# Patient Record
Sex: Female | Born: 1960
Health system: Southern US, Community
[De-identification: ages and names within clinical notes are randomized; demographics above are authoritative.]

## PROBLEM LIST (undated history)

## (undated) DIAGNOSIS — K589 Irritable bowel syndrome without diarrhea: Secondary | ICD-10-CM

## (undated) DIAGNOSIS — B259 Cytomegaloviral disease, unspecified: Secondary | ICD-10-CM

## (undated) DIAGNOSIS — K449 Diaphragmatic hernia without obstruction or gangrene: Secondary | ICD-10-CM

## (undated) HISTORY — DX: Diaphragmatic hernia without obstruction or gangrene: K44.9

## (undated) HISTORY — PX: OTHER SURGICAL HISTORY: SHX169

## (undated) HISTORY — DX: Irritable bowel syndrome, unspecified: K58.9

## (undated) HISTORY — DX: Cytomegaloviral disease, unspecified: B25.9

---

## 1998-08-13 ENCOUNTER — Ambulatory Visit (HOSPITAL_COMMUNITY): Admission: RE | Admit: 1998-08-13 | Discharge: 1998-08-13 | Payer: Self-pay | Admitting: Obstetrics and Gynecology

## 1998-08-13 ENCOUNTER — Encounter: Payer: Self-pay | Admitting: Obstetrics and Gynecology

## 1999-04-01 ENCOUNTER — Other Ambulatory Visit: Admission: RE | Admit: 1999-04-01 | Discharge: 1999-04-01 | Payer: Self-pay | Admitting: Obstetrics and Gynecology

## 2000-02-03 ENCOUNTER — Encounter: Payer: Self-pay | Admitting: Specialist

## 2000-02-03 ENCOUNTER — Ambulatory Visit (HOSPITAL_COMMUNITY): Admission: RE | Admit: 2000-02-03 | Discharge: 2000-02-03 | Payer: Self-pay | Admitting: Specialist

## 2000-03-07 ENCOUNTER — Encounter
Admission: RE | Admit: 2000-03-07 | Discharge: 2000-03-24 | Payer: Self-pay | Admitting: Physical Medicine & Rehabilitation

## 2000-08-17 ENCOUNTER — Encounter: Admission: RE | Admit: 2000-08-17 | Discharge: 2000-08-17 | Payer: Self-pay | Admitting: Family Medicine

## 2000-08-17 ENCOUNTER — Encounter: Payer: Self-pay | Admitting: Family Medicine

## 2000-09-29 ENCOUNTER — Encounter: Payer: Self-pay | Admitting: Gastroenterology

## 2000-09-29 ENCOUNTER — Ambulatory Visit (HOSPITAL_COMMUNITY): Admission: RE | Admit: 2000-09-29 | Discharge: 2000-09-29 | Payer: Self-pay | Admitting: Gastroenterology

## 2000-10-06 ENCOUNTER — Encounter (INDEPENDENT_AMBULATORY_CARE_PROVIDER_SITE_OTHER): Payer: Self-pay

## 2000-10-06 ENCOUNTER — Other Ambulatory Visit: Admission: RE | Admit: 2000-10-06 | Discharge: 2000-10-06 | Payer: Self-pay | Admitting: Gastroenterology

## 2000-10-10 ENCOUNTER — Encounter: Payer: Self-pay | Admitting: Gastroenterology

## 2000-10-10 ENCOUNTER — Ambulatory Visit (HOSPITAL_COMMUNITY): Admission: RE | Admit: 2000-10-10 | Discharge: 2000-10-10 | Payer: Self-pay | Admitting: Gastroenterology

## 2000-12-11 ENCOUNTER — Ambulatory Visit (HOSPITAL_COMMUNITY): Admission: RE | Admit: 2000-12-11 | Discharge: 2000-12-11 | Payer: Self-pay | Admitting: General Surgery

## 2000-12-11 ENCOUNTER — Encounter: Payer: Self-pay | Admitting: General Surgery

## 2000-12-13 ENCOUNTER — Ambulatory Visit (HOSPITAL_COMMUNITY): Admission: RE | Admit: 2000-12-13 | Discharge: 2000-12-13 | Payer: Self-pay | Admitting: General Surgery

## 2000-12-13 ENCOUNTER — Encounter: Payer: Self-pay | Admitting: General Surgery

## 2001-03-15 ENCOUNTER — Other Ambulatory Visit: Admission: RE | Admit: 2001-03-15 | Discharge: 2001-03-15 | Payer: Self-pay | Admitting: Internal Medicine

## 2001-05-15 ENCOUNTER — Observation Stay (HOSPITAL_COMMUNITY): Admission: RE | Admit: 2001-05-15 | Discharge: 2001-05-16 | Payer: Self-pay | Admitting: General Surgery

## 2001-05-15 ENCOUNTER — Encounter: Payer: Self-pay | Admitting: General Surgery

## 2001-05-15 ENCOUNTER — Encounter (INDEPENDENT_AMBULATORY_CARE_PROVIDER_SITE_OTHER): Payer: Self-pay

## 2002-01-14 ENCOUNTER — Encounter: Admission: RE | Admit: 2002-01-14 | Discharge: 2002-01-14 | Payer: Self-pay | Admitting: Obstetrics and Gynecology

## 2002-01-14 ENCOUNTER — Encounter: Payer: Self-pay | Admitting: Obstetrics and Gynecology

## 2002-10-21 ENCOUNTER — Encounter: Payer: Self-pay | Admitting: Obstetrics and Gynecology

## 2002-10-21 ENCOUNTER — Ambulatory Visit (HOSPITAL_COMMUNITY): Admission: RE | Admit: 2002-10-21 | Discharge: 2002-10-21 | Payer: Self-pay | Admitting: Obstetrics and Gynecology

## 2003-02-21 ENCOUNTER — Encounter: Payer: Self-pay | Admitting: Obstetrics and Gynecology

## 2003-02-21 ENCOUNTER — Encounter: Admission: RE | Admit: 2003-02-21 | Discharge: 2003-02-21 | Payer: Self-pay | Admitting: Obstetrics and Gynecology

## 2003-11-24 ENCOUNTER — Ambulatory Visit (HOSPITAL_COMMUNITY): Admission: RE | Admit: 2003-11-24 | Discharge: 2003-11-24 | Payer: Self-pay | Admitting: Specialist

## 2004-02-12 ENCOUNTER — Ambulatory Visit (HOSPITAL_COMMUNITY): Admission: RE | Admit: 2004-02-12 | Discharge: 2004-02-12 | Payer: Self-pay | Admitting: Family Medicine

## 2004-06-17 ENCOUNTER — Encounter: Admission: RE | Admit: 2004-06-17 | Discharge: 2004-06-17 | Payer: Self-pay | Admitting: Obstetrics and Gynecology

## 2004-08-12 ENCOUNTER — Encounter (INDEPENDENT_AMBULATORY_CARE_PROVIDER_SITE_OTHER): Payer: Self-pay | Admitting: *Deleted

## 2004-08-12 ENCOUNTER — Inpatient Hospital Stay (HOSPITAL_COMMUNITY): Admission: RE | Admit: 2004-08-12 | Discharge: 2004-08-14 | Payer: Self-pay | Admitting: Obstetrics and Gynecology

## 2005-06-20 ENCOUNTER — Encounter: Admission: RE | Admit: 2005-06-20 | Discharge: 2005-06-20 | Payer: Self-pay | Admitting: Obstetrics and Gynecology

## 2006-06-21 ENCOUNTER — Encounter: Admission: RE | Admit: 2006-06-21 | Discharge: 2006-06-21 | Payer: Self-pay | Admitting: Obstetrics and Gynecology

## 2007-06-25 ENCOUNTER — Encounter: Admission: RE | Admit: 2007-06-25 | Discharge: 2007-06-25 | Payer: Self-pay | Admitting: Obstetrics and Gynecology

## 2008-04-09 ENCOUNTER — Ambulatory Visit (HOSPITAL_COMMUNITY): Admission: RE | Admit: 2008-04-09 | Discharge: 2008-04-09 | Payer: Self-pay | Admitting: Family Medicine

## 2008-06-26 ENCOUNTER — Encounter: Admission: RE | Admit: 2008-06-26 | Discharge: 2008-06-26 | Payer: Self-pay | Admitting: Obstetrics and Gynecology

## 2009-07-01 ENCOUNTER — Encounter: Admission: RE | Admit: 2009-07-01 | Discharge: 2009-07-01 | Payer: Self-pay | Admitting: Obstetrics and Gynecology

## 2010-08-05 ENCOUNTER — Other Ambulatory Visit: Payer: Self-pay | Admitting: Family Medicine

## 2010-08-05 DIAGNOSIS — Z1231 Encounter for screening mammogram for malignant neoplasm of breast: Secondary | ICD-10-CM

## 2010-08-17 ENCOUNTER — Ambulatory Visit
Admission: RE | Admit: 2010-08-17 | Discharge: 2010-08-17 | Disposition: A | Discharge status: Home / Self Care | Payer: Commercial Managed Care - PPO | Source: Ambulatory Visit | Attending: Family Medicine | Admitting: Family Medicine

## 2010-08-17 DIAGNOSIS — Z1231 Encounter for screening mammogram for malignant neoplasm of breast: Secondary | ICD-10-CM

## 2010-08-18 ENCOUNTER — Observation Stay (HOSPITAL_COMMUNITY)
Admission: EM | Admit: 2010-08-18 | Discharge: 2010-08-21 | Disposition: A | Discharge status: Home / Self Care | Payer: 59 | Attending: Internal Medicine | Admitting: Internal Medicine

## 2010-08-18 ENCOUNTER — Emergency Department (HOSPITAL_COMMUNITY): Payer: 59

## 2010-08-18 DIAGNOSIS — R0789 Other chest pain: Principal | ICD-10-CM | POA: Insufficient documentation

## 2010-08-18 DIAGNOSIS — M542 Cervicalgia: Secondary | ICD-10-CM | POA: Insufficient documentation

## 2010-08-18 DIAGNOSIS — F411 Generalized anxiety disorder: Secondary | ICD-10-CM | POA: Insufficient documentation

## 2010-08-18 DIAGNOSIS — Z981 Arthrodesis status: Secondary | ICD-10-CM | POA: Insufficient documentation

## 2010-08-18 DIAGNOSIS — Z79899 Other long term (current) drug therapy: Secondary | ICD-10-CM | POA: Insufficient documentation

## 2010-08-18 DIAGNOSIS — R928 Other abnormal and inconclusive findings on diagnostic imaging of breast: Secondary | ICD-10-CM

## 2010-08-18 DIAGNOSIS — K219 Gastro-esophageal reflux disease without esophagitis: Secondary | ICD-10-CM | POA: Insufficient documentation

## 2010-08-18 DIAGNOSIS — K589 Irritable bowel syndrome without diarrhea: Secondary | ICD-10-CM | POA: Insufficient documentation

## 2010-08-18 DIAGNOSIS — M47812 Spondylosis without myelopathy or radiculopathy, cervical region: Secondary | ICD-10-CM | POA: Insufficient documentation

## 2010-08-18 DIAGNOSIS — Z7982 Long term (current) use of aspirin: Secondary | ICD-10-CM | POA: Insufficient documentation

## 2010-08-18 LAB — CBC
Hemoglobin: 14 g/dL (ref 12.0–15.0)
MCH: 31 pg (ref 26.0–34.0)
Platelets: 268 10*3/uL (ref 150–400)
RBC: 4.52 MIL/uL (ref 3.87–5.11)
WBC: 7.8 10*3/uL (ref 4.0–10.5)

## 2010-08-18 LAB — URINALYSIS, ROUTINE W REFLEX MICROSCOPIC
Glucose, UA: NEGATIVE mg/dL
Ketones, ur: NEGATIVE mg/dL
Nitrite: NEGATIVE
Specific Gravity, Urine: 1.013 (ref 1.005–1.030)
pH: 5.5 (ref 5.0–8.0)

## 2010-08-18 LAB — COMPREHENSIVE METABOLIC PANEL
AST: 17 U/L (ref 0–37)
Albumin: 4.2 g/dL (ref 3.5–5.2)
Alkaline Phosphatase: 42 U/L (ref 39–117)
BUN: 11 mg/dL (ref 6–23)
Chloride: 105 mEq/L (ref 96–112)
GFR calc Af Amer: >60 mL/min (ref >60)
Potassium: 3.8 mEq/L (ref 3.5–5.1)
Sodium: 138 mEq/L (ref 135–145)
Total Bilirubin: 0.8 mg/dL (ref 0.3–1.2)
Total Protein: 7.2 g/dL (ref 6.0–8.3)

## 2010-08-18 LAB — POCT CARDIAC MARKERS
Myoglobin, poc: 42.5 ng/mL (ref 12–200)
Myoglobin, poc: 48.4 ng/mL (ref 12–200)
Troponin i, poc: <0.05 ng/mL (ref 0.00–0.09)

## 2010-08-18 LAB — DIFFERENTIAL
Basophils Absolute: 0 10*3/uL (ref 0.0–0.1)
Basophils Relative: 0 % (ref 0–1)
Eosinophils Absolute: 0.1 10*3/uL (ref 0.0–0.7)
Monocytes Relative: 5 % (ref 3–12)
Neutro Abs: 4.9 10*3/uL (ref 1.7–7.7)
Neutrophils Relative %: 63 % (ref 43–77)

## 2010-08-19 DIAGNOSIS — R072 Precordial pain: Secondary | ICD-10-CM

## 2010-08-19 LAB — CARDIAC PANEL(CRET KIN+CKTOT+MB+TROPI)
CK, MB: 0.7 ng/mL (ref 0.3–4.0)
Relative Index: INVALID (ref 0.0–2.5)
Relative Index: INVALID (ref 0.0–2.5)
Total CK: 55 U/L (ref 7–177)
Troponin I: 0.01 ng/mL (ref 0.00–0.06)

## 2010-08-19 LAB — LIPID PANEL
Cholesterol: 134 mg/dL (ref 0–200)
HDL: 37 mg/dL — ABNORMAL LOW (ref >39)
LDL Cholesterol: 78 mg/dL (ref 0–99)
Total CHOL/HDL Ratio: 3.6 RATIO
VLDL: 19 mg/dL (ref 0–40)

## 2010-08-19 LAB — URINE CULTURE
Colony Count: NO GROWTH
Culture: NO GROWTH

## 2010-08-19 LAB — APTT: aPTT: 29 seconds (ref 24–37)

## 2010-08-19 LAB — PROTIME-INR
INR: 1.05 (ref 0.00–1.49)
Prothrombin Time: 13.9 seconds (ref 11.6–15.2)

## 2010-08-20 ENCOUNTER — Observation Stay (HOSPITAL_COMMUNITY): Payer: 59

## 2010-08-20 NOTE — H&P (Signed)
[name], Powell                 ACCOUNT NO.:  [id number]  MEDICAL RECORD NO.:  [id number]           PATIENT TYPE:  E  LOCATION:  WLED                         FACILITY:  [facility]  PHYSICIAN:  Conley Canal, MD      DATE OF BIRTH:  04-18-1961  DATE OF ADMISSION:  08/18/2010 DATE OF DISCHARGE:                             HISTORY & PHYSICAL   PRIMARY CARE PHYSICIAN:  Dr. Cam Hai.  CHIEF COMPLAINT:  Chest pain x1 day.  HISTORY OF PRESENT ILLNESS:  [name] is a pleasant 50 year old female with no significant medical problems who comes in with complaints of left-sided chest pain described as pressure-like which started as left jaw and neck pain after dinner last night.  The pain is described as severe.  It radiates to the left arm, got some relief with ketorolac and nitroglycerin after about 1 hour.  It was associated with some nausea and diaphoresis.  The patient says she has never had this kind of pain previously.  She denied any fever or cough.  The pain improved to around 3/10 with nitroglycerin.  Currently, she is chest pain free.  PAST MEDICAL HISTORY:  None.  HOME MEDICATIONS:  Aspirin, over-the-counter medications including glutamine, lysine, magnesium, multivitamins, KCl, Soy Menopause Oral, vitamin B12, vitamin B6.  ALLERGIES:  CEFAZOLIN, DOXYCYCLINE, FLEXERIL, IODINE, MACROBID, ROBAXIN, AMITRIPTYLINE, SCOPOLAMINE, DEMEROL, ETODOLAC.  SOCIAL HISTORY:  The patient denies cigarette smoking or illicit drugs. Occasionally drinks alcohol.  Married, lives with her husband.  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  FAMILY HISTORY:  Positive for heart disease on her father's side as well as on her mom's side.  REVIEW OF SYSTEMS:  Unremarkable except as highlighted in the history of present illness.  PHYSICAL EXAMINATION:  GENERAL:  This is a pleasant lady who is not in acute distress. VITAL SIGNS:  Blood pressure  123/48, heart rate is 89, temperature 98.6, respirations 18, oxygen saturation is 95% on room air. HEAD, EARS, NOSE, AND THROAT:  Pupils equal reacting to light.  No jugular venous distention.  No carotid bruits. RESPIRATORY:  Good air entry bilaterally with no rhonchi, rales, or wheezes. CARDIOVASCULAR:  First and heart sounds heard.  No murmurs.  Pulses regular. ABDOMEN:  Soft, nontender.  No palpable organomegaly.  Bowel sounds are normal. CNS:  The patient is alert and oriented to person, place, and time with no acute focal neurological deficits. EXTREMITIES:  No pedal edema.  Peripheral pulses are equal.  Labs were reviewed, significant for CBC normal.  Electrolytes normal. Point-of-care cardiac enzymes negative x2.  Chest x-ray showed no acute abnormality.  EKG normal sinus rhythm.  No significant ST-T wave changes.  IMPRESSION:  A 50 year old female who comes with chest pain concerning for acute coronary syndrome.  She has lower risk factors other than her age and family history.  PLAN: 1. We will admit the patient to observation telemetry, perform serial     cardiac enzymes, obtain 2-D echocardiogram.  If she rules out for     MI, we would prefer outpatient stress test.  Meanwhile,  she will be     on aspirin, Lovenox.  We will also check TSH. 2. GI prophylaxis.  PPI. 3. The patient's condition is guarded.     Conley Canal, MD     SR/MEDQ  D:  08/18/2010  T:  08/18/2010  Job:  [id number]  cc:   Dr. Cam Hai.  Electronically Signed by Conley Canal  on 08/19/2010 08:12:29 PM

## 2010-08-21 ENCOUNTER — Observation Stay (HOSPITAL_COMMUNITY): Payer: 59 | Attending: Interventional Cardiology

## 2010-08-21 DIAGNOSIS — R079 Chest pain, unspecified: Secondary | ICD-10-CM | POA: Insufficient documentation

## 2010-08-21 MED ORDER — TECHNETIUM TC 99M TETROFOSMIN IV KIT
10.0000 | PACK | Freq: Once | INTRAVENOUS | Status: AC | PRN
  Administered 2010-08-21: 10 via INTRAVENOUS

## 2010-08-21 MED ORDER — TECHNETIUM TC 99M TETROFOSMIN IV KIT
30.0000 | PACK | Freq: Once | INTRAVENOUS | Status: AC | PRN
  Administered 2010-08-21: 30 via INTRAVENOUS

## 2010-08-22 ENCOUNTER — Other Ambulatory Visit (HOSPITAL_COMMUNITY): Payer: 59

## 2010-08-31 ENCOUNTER — Ambulatory Visit
Admit: 2010-08-31 | Discharge: 2010-08-31 | Disposition: A | Discharge status: Home / Self Care | Payer: 59 | Attending: Family Medicine | Admitting: Family Medicine

## 2010-09-01 NOTE — Discharge Summary (Signed)
[name], Powell                 ACCOUNT NO.:  [id number]  MEDICAL RECORD NO.:  [id number]           PATIENT TYPE:  I  LOCATION:  1415                         FACILITY:  [facility]  PHYSICIAN:  Baltazar Najjar, MD     DATE OF BIRTH:  1961/02/16  DATE OF ADMISSION:  08/18/2010 DATE OF DISCHARGE:  08/21/2010                              DISCHARGE SUMMARY   FINAL DISCHARGE DIAGNOSES: 1. Chest pain probably musculoskeletal in nature with negative nuclear     stress test. 2. History of cervical degenerative joint disease. 3. Mammogram findings of possible left breast mass.  CONSULTATIONS DURING THIS HOSPITALIZATION:  Patient was seen by Lyn Records, M.D., from cardiology service.  PROCEDURES AND IMAGING: 1. Nuclear stress test done today showed no evidence of inducible left     ventricular myocardial ischemia or scarring.  Left ventricular     ejection fraction calculated at 66%. 2. The 2-D echocardiogram done on August 19, 2010 showed mild LVH, EF     of 60%.  Aortic valve sclerosis without stenosis. 3. Chest x-ray showed no acute abnormality. 4. Cervical spine x-ray showed postoperative and degenerative changes.  BRIEF ADMITTING HISTORY:  Please refer to the H and P for more details.  In summary, [name] is a 50 year old pleasant woman with no significant past medical history, presented to the ER with chief complaint of left-sided chest pain, which she described as pressure- like, radiate to the left arm.  HOSPITAL COURSE: 1. Patient was admitted to telemetry floor.  Cardiac enzymes were     cycled, were unremarkable.  EKG did not show any significant     abnormalities.  Patient was seen by cardiology service and nuclear     stress test was ordered, which was done today that did not show any     inducible ischemia.  Her chest pain is probably musculoskeletal in     nature. 2. Incidental finding of possible left breast mass on mammogram.     Patient had a followup  appointment arranged for additional views     and any further workup as needed. 3. History of cervical degenerative joint disease.  Continue pain meds     p.r.n. 4. Patient seen and examined by me today and she is stable for     discharge to home today to follow with her PCP.  DISCHARGE MEDICATIONS: 1. Aleve 220 mg 1 tablet p.o. twice daily as needed. 2. Enteric-coated aspirin 81 mg p.o. daily. 3. Calcium carbonate/vitamin D 1 tablet p.o. q.p.m. 4. Glucosamine 1 tablet p.o. daily. 5. Lysine 500 mg p.o. daily. 6. Magnesium 1 tablet p.o. daily. 7. Multivitamin 1 tablet p.o. daily. 8. Naftin 1% cream one application topically as needed daily. 9. Potassium 1 tablet p.o. daily. 10.Cefazolin 1 tablet p.o. daily. 11.Vitamin B12 one tablet p.o. daily. 12.Vitamin B6 one tablet p.o. daily.  DISCHARGE INSTRUCTIONS: 1. Patient advised to continue taking above medications as prescribed. 2. Patient advised to avoid any heavy lifting. 3. Patient to follow with her PCP within 1 week of discharge and to     [facility] as [facility] for additional views of     her left breast. 4. Patient to report any worsening of symptom or any new symptoms to     her PCP or come to the ER.  CONDITION ON DISCHARGE:  Stable.          ______________________________ Baltazar Najjar, MD     SA/MEDQ  D:  08/21/2010  T:  08/21/2010  Job:  [id number]  cc:   Lupita Raider, M.D. Fax: [phone]  Lyn Records, M.D. Fax: [phone]  Electronically Signed by [name] MD on 09/01/2010 07:44:03 PM

## 2010-09-09 ENCOUNTER — Other Ambulatory Visit (HOSPITAL_COMMUNITY): Payer: 59

## 2010-10-01 NOTE — Consult Note (Signed)
NAME:  [name], [name]                 ACCOUNT NO.:  [id number]  MEDICAL RECORD NO.:  [id number]           PATIENT TYPE:  I  LOCATION:  1415                         FACILITY:  [facility]  PHYSICIAN:  Lyn Records, M.D.   DATE OF BIRTH:  Nov 18, 1960  DATE OF CONSULTATION:  08/19/2010 DATE OF DISCHARGE:                                CONSULTATION   REASON FOR CONSULTATION:  Chest pain.  CONCLUSIONS: 1. Chest pain, present for greater than 48 hours of uncertain cause.     Myocardial infarction has been ruled out by enzymes and EKGs.     Differential at this time includes pericarditis, musculoskeletal     pain, coronary artery disease with an unusual presentation, or     other. 2. Recent fall, landing on her left hip. 3. History of cervical disk disease with previous surgeries.  RECOMMENDATIONS: 1. IV Decadron x1 to treat acute inflammatory process such as possible     pericarditis/pleuropericarditis. 2. Stress nuclear study on August 20, 2010. 3. Check erythrocyte sedimentation rate, D-dimer, and continue to     cycle cardiac markers. 4. Get EKG for recurrent chest pain.  COMMENTS:  The patient is very pleasant 50 year old, who works in the [employer].  Starting on Tuesday, she began having left chest pressure with radiation into the left clavicular area, left jaw, and left arm.  The discomfort has not gone away since it started. It will wax and wane in intensity.  Even at this time, she is having a burning-type discomfort/bruise, tight feeling in the left mid precordial area.  There is no nausea or vomiting.  Activity is not  influenced. Nitroglycerin has been used on 3 separate occasions since admission and has now brought significant relief.  Multiple sets of cardiac markers are normal.  She does have a history of cervical disk disease.  MEDICATIONS:  Aspirin, potassium, multiple vitamins, magnesium, vitamin B12, vitamin B6, glycine, and  glutamine.  ALLERGIES: 1. CEFAZOLIN. 2. DOXYCYCLINE. 3. FLEXERIL. 4. IODINE. 5. MACROBID. 6. ROBAXIN. 7. AMITRIPTYLINE. 8. SCOPOLAMINE. 9. DEMEROL. 10.ETODOLAC.  SOCIAL HISTORY:  Never smoked.  Denies alcohol.  She is married, has grandchildren, works in [employer].  FAMILY HISTORY:  Father has heart disease but not a myocardial infarction.  Her mother does not have heart disease.  No siblings have heart disease.  PHYSICAL EXAMINATION:  GENERAL:  The patient is in no acute distress. VITAL SIGNS:  Blood pressure is 100/60, the heart rate is 72. NECK:  Veins are not distended.  There are no carotid bruits. LUNGS:  Clear.  No pleural rub is heard. CARDIAC:  Unremarkable.  There is a physiologically split S2, no murmurs are heard.  No rub is heard. ABDOMEN:  Soft.  Liver and spleen are not palpable. EXTREMITIES:  Reveal no edema.  Posterior tibial pulses are 2+. NEUROLOGIC:  Normal.  The patient's affect is anxious and tense.  EKGs reveal no acute ST-T wave change and basically normal tracing yesterday and today.  Multiple sets of cardiac markers are normal. Chest x-ray is unremarkable.  DISCUSSION:  Prolonged chest pain without EKG  or enzyme evidence of ischemia/infarction is unusual but not unheard of.  This patient has minimal risk factors with the only significant risk factor being an HDL of 37.  Her total cholesterol was 134 and LDL was 78.  I will give her a shot of Decadron.  I have a suspicion that her pain may be inflammatory/serositis.  If it goes away completely after 1 shot of Decadron, that diagnosis would be inferred.  I will go ahead and set her up for stress nuclear study to be done on August 20, 2010, at [facility]. We should continue to cycle cardiac markers.     Lyn Records, M.D.     HWS/MEDQ  D:  08/19/2010  T:  08/20/2010  Job:  [id number]  Electronically Signed by Verdis Prime M.D. on 10/01/2010 04:52:40 PM

## 2010-10-29 NOTE — Op Note (Signed)
[facility]  Patient:    [name], [name] Visit Number: [id number] MRN: [id number]          Service Type: SUR Location: 4W [id number] 01 Attending Physician:  Carson Myrtle Dictated by:   Sheppard Plumber Earlene Plater, M.D. Proc. 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  OPERATIVE PROCEDURE:  Laparoscopy, lysis of adhesions, laparoscopic cholecystectomy, and operative cholangiogram.  SURGEON:  Timothy E. Earlene Plater, M.D.  ASSISTANT:  Zigmund Daniel, M.D.  ANESTHESIA:  CRNA supervised MD.  INDICATIONS FOR PROCEDURE:  The patient is 32, well-known to me.  Has chronic recurrent abdominal pain that has become nearly disabling.  She is particularly fat food intolerant with frequent nausea, vomiting, abdominal bloating, and consistent right upper quadrant pain.  In spite of a negative workup, we had agreed to proceed with this laparoscopy and removal of gallbladder.  She is in full agreement.  DESCRIPTION OF PROCEDURE:  The patient was brought to the operating room and placed supine.  General endotracheal anesthesia administered.  The abdomen was scrubbed, prepped, and draped in the usual fashion.  An infraumbilical incision was made through an old laparoscopy incision.  The fascia was identified, opened in the midline.  The peritoneum entered without complications.  The Hasson catheter placed, tied in place, and the abdomen insufflated.  Generalized laparoscopy with pictures included shows an enlarged right ovary, a slightly enlarged uterus, a normal cecum, some adhesions of the mid colon to the right middle abdominal wall, normal-appearing gallbladder, and no other findings.  Pictures were made and  _________.  A second 10 mm trocar was placed in the midepigastrium, and two 5 mm trocars in the right upper quadrant.  The gallbladder was grasped and placed on tension.  Careful dissection at the base of the gallbladder revealed a normal-appearing cystic duct entering the gallbladder.  This was dissected completely out as well as the cystic duct, both above and behind the gallbladder, and the anatomy clearly identified.  A clip was placed at the junction of the cystic duct and the gallbladder.  A small incision made and the cystic duct was tiny, barely admitting the Reddick catheter.  Real-Time fluoroscopy was used which showed prompt filling of the common bile duct with emptying into the duodenum and then with some manipulation of the catheter, the remaining hepatic tree filled normal.  X-ray prints were made of these fluoroscopy shots.  With [place] feeling that this was normal, the remnant of the cystic duct was doubly clipped, and completely divided.  The cystic artery was likewise triply clipped and divided.  The gallbladder was removed from the gallbladder bed without incident or complication.  The gallbladder bed was dry.  There was no blood nor bile.  The adhesions of the mid ascending colon were taken down bluntly with the cautery from the anterior abdominal wall and there were no complications.  The irrigant was clear.  The gallbladder removed through the infraumbilical incision.  All irrigant, CO2, instruments, and trocars removed from the abdomen.  The skin incisions were closed with 3-0 Monocryl. Steri-Strips were applied and counts correct.  The gallbladder was inspected and it had one single opening at the cystic  duct.  The gallbladder wall appeared normal.  There was thick bile, but no sludge and no stones.  It was submitted open to pathology.  This completed the procedure.  The patient was taken to the recovery room in good condition. Dictated by:   Sheppard Plumber Earlene Plater,  M.D. Attending Physician:  Carson Myrtle DD:  05/15/01 TD:  05/15/01 Job: [id number]

## 2010-10-29 NOTE — H&P (Signed)
NAME:  [name], [name]                 ACCOUNT NO.:  [id number]   MEDICAL RECORD NO.:  [id number]          PATIENT TYPE:  INP   LOCATION:  NA                           FACILITY:  [facility]   PHYSICIAN:  Malachi Pro. Ambrose Mantle, M.D. DATE OF BIRTH:  [birth year]   DATE OF ADMISSION:  08/12/2004  DATE OF DISCHARGE:                                HISTORY & PHYSICAL   HISTORY OF PRESENT ILLNESS:  This is a 50 year old married white female,  para 2, 0-0-2, who is admitted to the hospital for abdominal hysterectomy,  possible salpingo-oophorectomy, because of severe menorrhagia, severe  dysmenorrhea, presumed adenomyosis.  Last menstrual period July 29, 2004.  The patient's periods occur every 28-30 days and last 4-5 days.  She  states that during her menses, she has severe pain in her right lower  quadrant going into her right leg.  She states that the pain is so severe  that it is difficult to move her right leg and that it interferes with her  work.  She has had ultrasound exams that show an enlarged uterus but no  specific adnexal abnormalities.  She states that on the second day of her  period, she uses 8-9 tampons and 1-2 large pads to control her flow.  She  also describes pain with and after intercourse, especially on her right  side.   ALLERGIES:  To multiple drugs, including LODINE, FLEXERIL, ROBAXIN,  CEPHALEXIN, SCOPOLAMINE, MACROBID, DOXYCYCLINE, AMITRIPTYLINE, GAS-X,  DEMEROL, and GASTROVIEW.  She is also allergic to PLASTIC TAPE and POISON  IVY.   MEDICATIONS:  Vitamins, Tylenol, glucosamine.  She has stopped soy, Advil,  and lysine.   OPERATIONS:  Deviated septum repaired in 1979.  C-sections in 1985 and 1988.  Tubal ligation, tonsillectomy, diskectomy in her neck in 1995.  Left knee  surgery in 2005.  Appendectomy in 1996.  Neck surgery in 1999.  Cholecystectomy in 2002.  Right knee surgery in 2001.   ILLNESSES:  She has reflux disease.  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   REVIEW OF SYSTEMS:  Patient does have headaches and also irritable bowel  syndrome.  She has no known heart problems.  Drinks alcohol occasionally.  Does not smoke.   FAMILY HISTORY:  Mother 31 with heart murmur.  Father 8, high blood  pressure, heart disease, diabetes, and poor circulation.  One sister and two  brothers are apparently living and well.   PHYSICAL EXAMINATION:  VITAL SIGNS:  Weight 190 pounds.  Pulse 80, blood  pressure 146/82.  GENERAL:  Slightly obese white female.  HEENT:  No cranial abnormalities.  Extraocular movements are intact.  Nose  and pharynx clear.  NECK:  Supple without thyromegaly.  LUNGS:  Clear to auscultation.  HEART:  Normal size and sounds.  No murmurs.  BREASTS:  Soft without masses, sitting up or lying down.  ABDOMEN:  Soft and nontender.  There is a deep crease suprapubically where  she has had the two C-sections, and I have informed her that the incision  will not be  made in that crease.  There are no masses in the abdomen.  There  is no tenderness.  The liver, spleen, and kidneys are not felt.  PELVIC:  Vulva and vagina are not clean.  Pap smear in November, 2005 was  within normal limits.  The vagina is clean, cervix clean, uterus anterior  thought to be upper limits of normal size and tender to motion.  The adnexa  are free of masses.  RECTAL:  Negative in November, 2005.   ADMITTING IMPRESSION:  Severe dysmenorrhea, menorrhagia, and dyspareunia,  presumed adenomyosis.  Patient is admitted for abdominal hysterectomy.  If  her tubes and ovaries appear normal, the patient does not want oophorectomy.   She has been informed of the risks of surgery, including but not limited to  heart attack, stroke, pulmonary embolus, wound disruption, hemorrhage with  need for reoperation and/or transfusion, fistula formation, nerve injury,  and intestinal obstruction.  She has also been informed that  hysterectomy  can have an unpredictable impact on her sex drive.  She understands and  agrees to proceed.      TFH/MEDQ  D:  08/11/2004  T:  08/11/2004  Job:  [id number]

## 2010-10-29 NOTE — Op Note (Signed)
NAME:  [name], [name]                 ACCOUNT NO.:  [id number]   MEDICAL RECORD NO.:  [id number]          PATIENT TYPE:  INP   LOCATION:  X001                         FACILITY:  [facility]   PHYSICIAN:  Malachi Pro. Ambrose Mantle, M.D. DATE OF BIRTH:  02/16/1961   DATE OF PROCEDURE:  08/12/2004  DATE OF DISCHARGE:                                 OPERATIVE REPORT   PREOPERATIVE DIAGNOSES:  1.  Menorrhagia  2.  Dysmenorrhea.  3.  Dyspareunia.  4.  Presumed adenomyosis.   POSTOPERATIVE DIAGNOSES:  1.  Menorrhagia  2.  Dysmenorrhea.  3.  Dyspareunia.  4.  Presumed adenomyosis.   OPERATION:  Abdominal hysterectomy.   OPERATOR:  Malachi Pro. Ambrose Mantle, M.D.   ASSISTANT:  Zenaida Niece, M.D.   ANESTHESIA:  General.   DESCRIPTION OF PROCEDURE:  The patient was brought to the operating room and  placed under satisfactory general anesthesia, placed in the frogleg  position.  The abdomen, vulva, vagina, and urethra were prepped with  Betadine solution and the bladder was catheterized with a Foley and hooked  to straight drain.  Exam revealed the uterus to be anterior, upper limit of  normal size.  I thought it seemed to be enlarged more to the right.  The  adnexa were free of masses.  The patient was placed supine.  She had two  previous cesarean section incisions in a very deep fold in her abdominal  wall so I chose to make an incision above this in a transverse direction.  Carried in layers through the skin, subcutaneous tissue, and fascia.  The  fascia was then separated from the rectus muscles superiorly and inferiorly.  There was considerable scarring here.  The rectus muscle was already divided  in the midline.  I entered the peritoneal cavity with sharp dissection  initially but then with my finger.  I explored the upper abdomen.  There was  considerable scar tissue in the right upper quadrant.  The liver felt  smooth.  Both kidneys felt smooth and normal.  Exploration of the pelvis  revealed  the posterior cul-de-sac to have no evidence of endometriosis. The  uterus was anterior, upper limits of normal size, seemed to be mottled and  brown suggesting adenomyosis.  The anterior cul-de-sac was normal.  Both  tubes showed evidence of previous tubal cauterization.  Both ovaries  appeared normal and the remainder of the tube that was left appeared normal.  Packs and retractors were used for exposure.  The patient had desperately  wanted her ovaries to be left in situ unless there was a pressing reason to  remove them, so we began an abdominal hysterectomy.   We clamped across the upper pedicles, drew the uterus into the operative  field, divided both round ligaments with the electrical current, developed a  bladder flap, divided the utero-ovarian ligament between clamps, and doubly  suture ligated it bilaterally.  I skeletonized the uterine vessels, clamped,  cut, and suture ligated them; clamped, cut, and suture ligated the  parametrial and paracervical tissues; and then clamped the uterosacral  ligaments bilaterally and  held them.  I entered the right vaginal angle and  removed the uterus by transecting the upper vagina.  Vaginal angle sutures  were placed.  Then the central portion of the vagina was closed with  interrupted figure-of-eight sutures of 0 Vicryl.  Some use of the Bovie was  required to obtain complete hemostasis.  I liberally irrigated the pelvis,  saw no active bleeding, identified both ureters that were well below the  operative field, sutured the uterosacral ligaments together in the midline,  and then with no bleeding present, all packs and retractors were removed.  The peritoneum was closed with a running suture of 0 Vicryl.  The rectus  muscle was not reapproximated.  The fascia was closed with two running  sutures of 0 Vicryl, subcu with a running 3-0 Vicryl, and the skin was  closed with automatic staples.  The patient seemed to tolerate the procedure  well.   Blood loss was about 200 cc.  Sponge and needle counts were correct  and she was returned to recovery in satisfactory condition.      TFH/MEDQ  D:  08/12/2004  T:  08/12/2004  Job:  [id number]

## 2010-10-29 NOTE — Discharge Summary (Signed)
NAMESOMAYA, [name]                 ACCOUNT NO.:  [id number]   MEDICAL RECORD NO.:  [id number]          PATIENT TYPE:  INP   LOCATION:  0443                         FACILITY:  [facility]   PHYSICIAN:  Malachi Pro. Ambrose Mantle, M.D. DATE OF BIRTH:  [birth year]   DATE OF ADMISSION:  08/12/2004  DATE OF DISCHARGE:  08/14/2004                                 DISCHARGE SUMMARY   REASON FOR ADMISSION AND HOSPITAL COURSE:  This is a 50 year old white  female admitted with menorrhagia, dysmenorrhea, and dyspareunia, and  presumed adenomyosis for abdominal hysterectomy.  She underwent that  procedure on August 12, 2004 without complications.  She did have a  temperature elevation to 100.7 and 100.5 during the first postoperative day.  She was treated with aggressive ambulation and pulmonary toilet with  incentive spirometry, remained afebrile thereafter.  On the second  postoperative day, she was ambulating well without difficulty, voiding well,  passing flatus.  Her abdomen was soft and nontender.  Incision was healing  well.  Staples removed and strips were applied and she was discharged.   Initial white count was 5500, hemoglobin 14.1, hematocrit 41.8, platelet  count 362,000.  Normal differential, normal comprehensive metabolic profile  except for glucose of 103.  Followup hematocrits were 36.6 and 35.3.  Pathology report showed a 185 g uterus with chronic cervicitis and squamous  metaplasia.  No intraepithelial lesion.  Endometrium:  Late proliferative,  early secretory phase endometrium.  No hyperplasia or malignancy.  Myometrium:  No malignancy identified.   FINAL DIAGNOSES:  1.  Menorrhagia.  2.  Dysmenorrhea.  3.  Dyspareunia.   OPERATION/PROCEDURE:  Abdominal hysterectomy.   FINAL CONDITION:  Improved.   DISCHARGE INSTRUCTIONS:  Regular discharge instructions.  No heavy lifting  or strenuous activity.  Call with any fever above 100.4 degrees.  Call with  any unusual problems.  No vaginal  entrance.  Vicodin generic 24 tablets one  or two every four to six hours as needed for pain is given at discharge.      TFH/MEDQ  D:  08/14/2004  T:  08/14/2004  Job:  [id number]

## 2011-02-15 ENCOUNTER — Inpatient Hospital Stay (INDEPENDENT_AMBULATORY_CARE_PROVIDER_SITE_OTHER)
Admission: RE | Admit: 2011-02-15 | Discharge: 2011-02-15 | Disposition: A | Discharge status: Home / Self Care | Payer: 59 | Source: Ambulatory Visit | Attending: Family Medicine | Admitting: Family Medicine

## 2011-02-15 DIAGNOSIS — J309 Allergic rhinitis, unspecified: Secondary | ICD-10-CM

## 2011-02-15 DIAGNOSIS — J069 Acute upper respiratory infection, unspecified: Secondary | ICD-10-CM

## 2011-08-09 ENCOUNTER — Other Ambulatory Visit: Payer: Self-pay | Admitting: Family Medicine

## 2011-08-09 DIAGNOSIS — Z1231 Encounter for screening mammogram for malignant neoplasm of breast: Secondary | ICD-10-CM

## 2011-09-07 ENCOUNTER — Ambulatory Visit: Payer: 59

## 2011-09-14 ENCOUNTER — Ambulatory Visit
Admission: RE | Admit: 2011-09-14 | Discharge: 2011-09-14 | Disposition: A | Discharge status: Home / Self Care | Payer: 59 | Source: Ambulatory Visit | Attending: Family Medicine | Admitting: Family Medicine

## 2011-09-14 DIAGNOSIS — Z1231 Encounter for screening mammogram for malignant neoplasm of breast: Secondary | ICD-10-CM

## 2011-10-03 ENCOUNTER — Other Ambulatory Visit: Payer: Self-pay | Admitting: Gastroenterology

## 2012-03-21 ENCOUNTER — Other Ambulatory Visit: Payer: Self-pay | Admitting: Dermatology

## 2012-07-18 IMAGING — CR DG CHEST 2V
2 series · 2 of 2 positions shown · non-contrast
Comparison: Chest CT dated 02/12/2004.

CLINICAL DATA: Left chest pain for 1 day.

CHEST - 2 VIEW

[w chest pa]
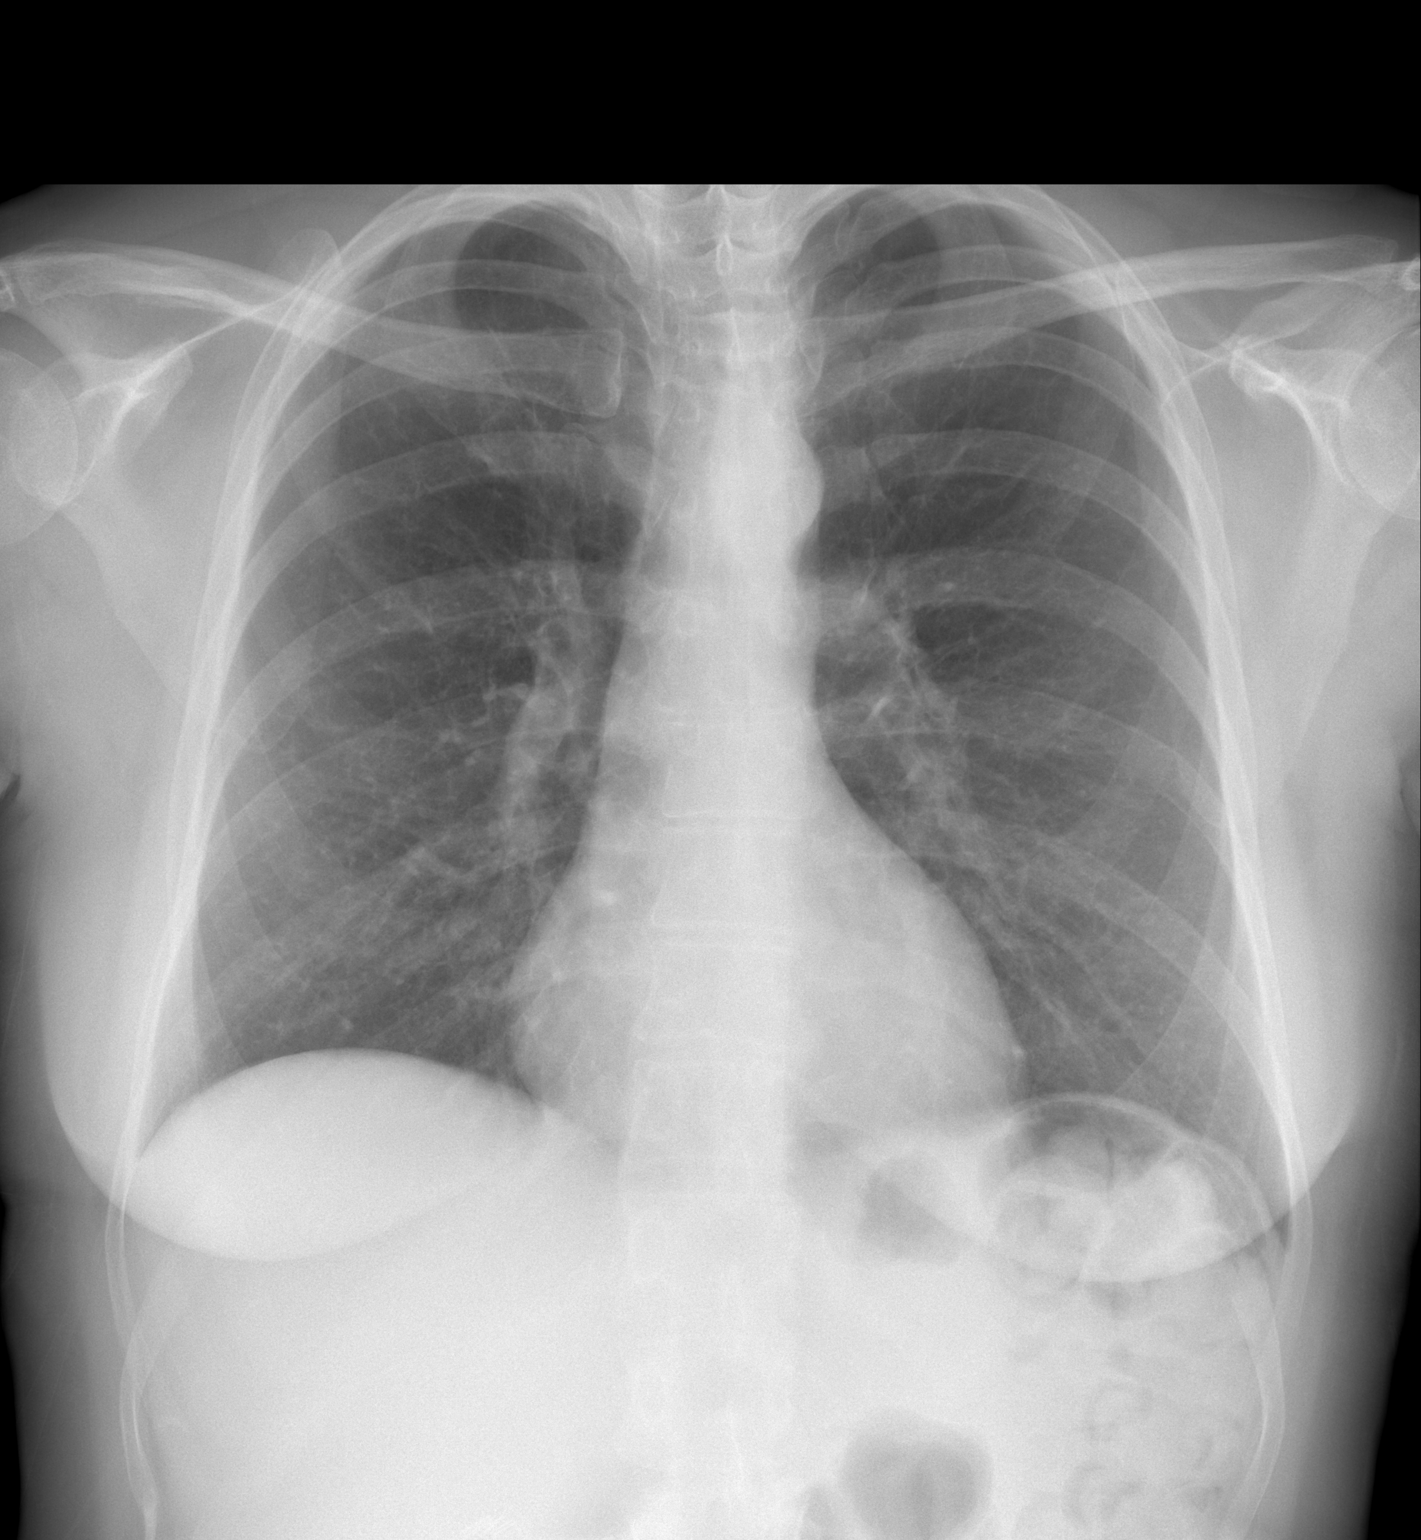

[w chest lat]
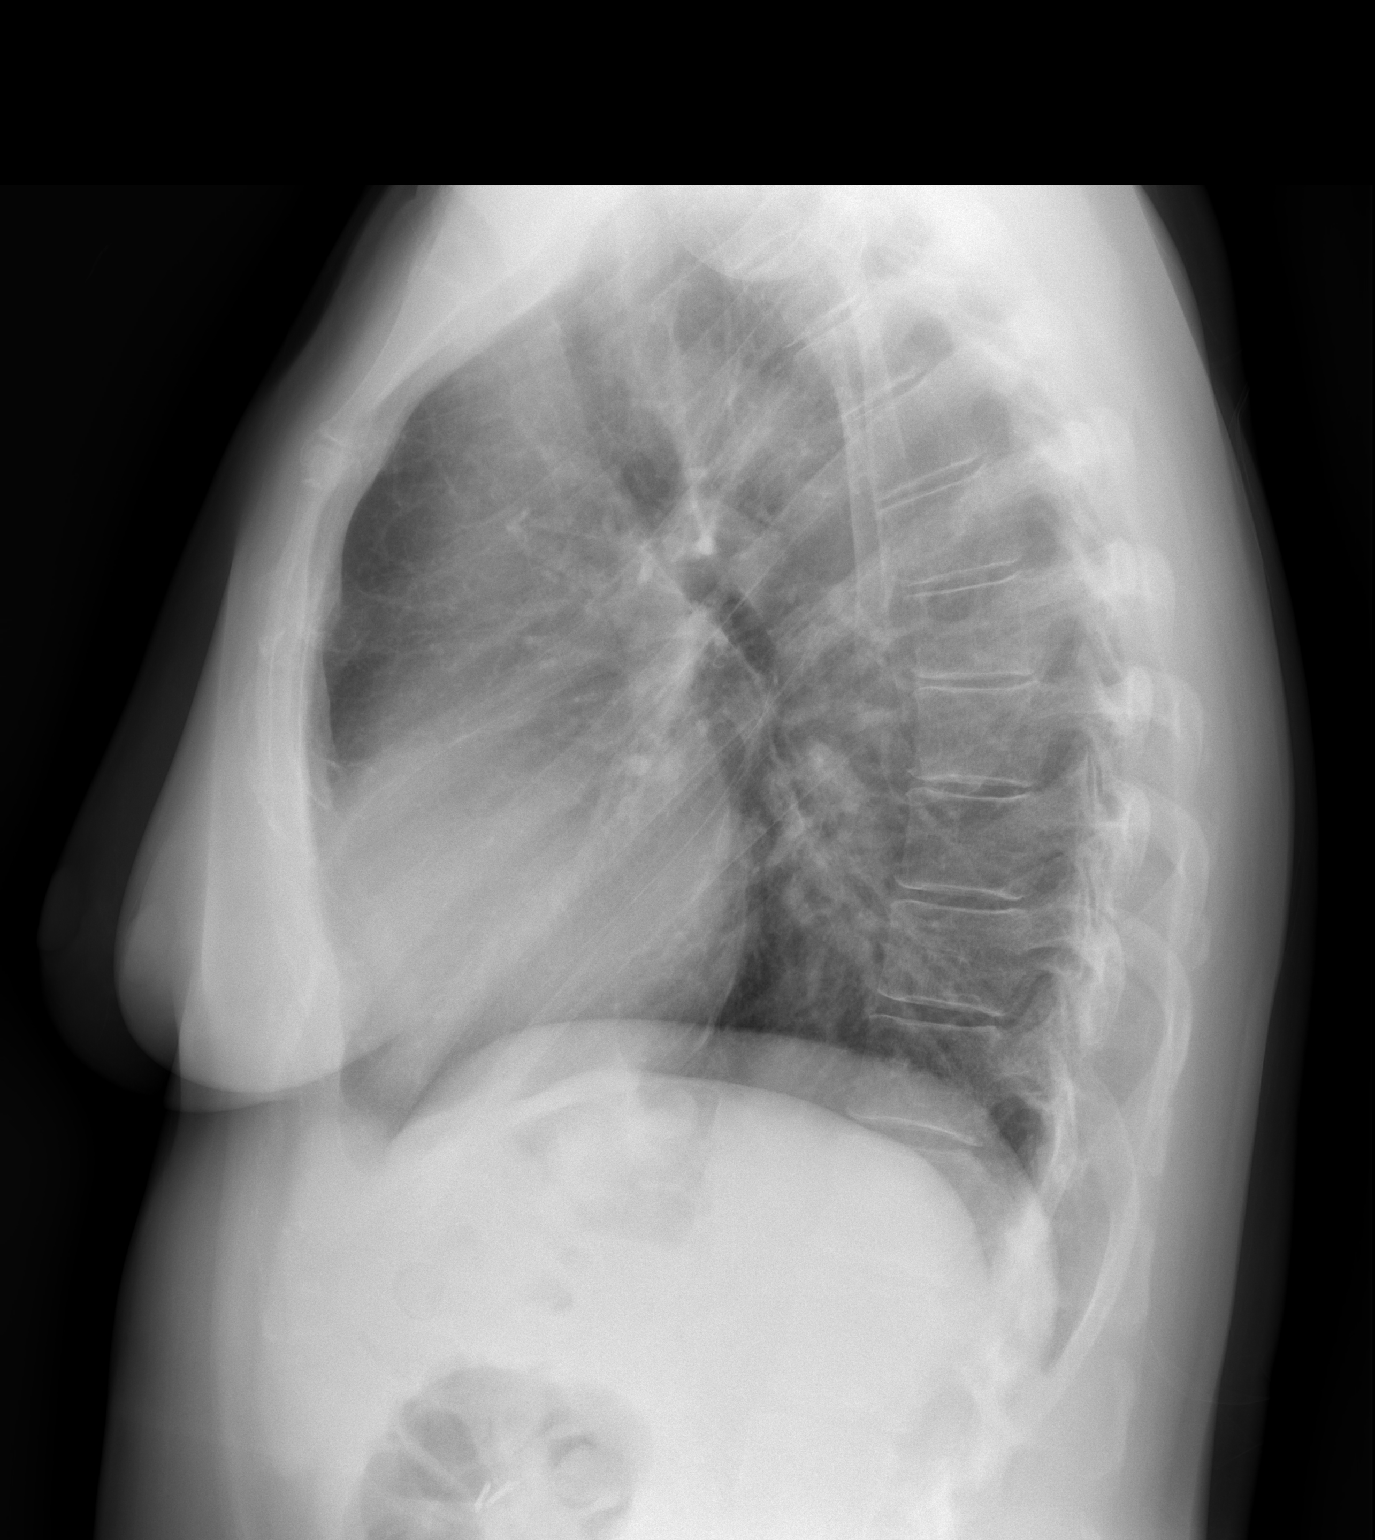

[2 of 2 positions shown; findings below may reference images not displayed]

FINDINGS: Normal sized heart.  Clear lungs.  Minimal thoracic spine
degenerative changes and minimal scoliosis.  Cholecystectomy clips.
IMPRESSION: No acute abnormality.

## 2012-09-13 ENCOUNTER — Other Ambulatory Visit: Payer: Self-pay

## 2012-09-13 DIAGNOSIS — Z1231 Encounter for screening mammogram for malignant neoplasm of breast: Secondary | ICD-10-CM

## 2012-10-17 ENCOUNTER — Ambulatory Visit
Admission: RE | Admit: 2012-10-17 | Discharge: 2012-10-17 | Disposition: A | Discharge status: Home / Self Care | Payer: 59 | Source: Ambulatory Visit

## 2012-10-17 DIAGNOSIS — Z1231 Encounter for screening mammogram for malignant neoplasm of breast: Secondary | ICD-10-CM

## 2013-03-20 ENCOUNTER — Other Ambulatory Visit: Payer: Self-pay | Admitting: Dermatology

## 2013-11-19 ENCOUNTER — Other Ambulatory Visit: Payer: Self-pay

## 2013-11-19 DIAGNOSIS — Z1231 Encounter for screening mammogram for malignant neoplasm of breast: Secondary | ICD-10-CM

## 2013-12-12 ENCOUNTER — Ambulatory Visit
Admission: RE | Admit: 2013-12-12 | Discharge: 2013-12-12 | Disposition: A | Discharge status: Home / Self Care | Payer: 59 | Source: Ambulatory Visit

## 2013-12-12 ENCOUNTER — Encounter (INDEPENDENT_AMBULATORY_CARE_PROVIDER_SITE_OTHER): Payer: Self-pay

## 2013-12-12 DIAGNOSIS — Z1231 Encounter for screening mammogram for malignant neoplasm of breast: Secondary | ICD-10-CM

## 2014-03-26 ENCOUNTER — Other Ambulatory Visit: Payer: Self-pay | Admitting: Dermatology

## 2014-05-07 ENCOUNTER — Other Ambulatory Visit (HOSPITAL_COMMUNITY): Payer: Self-pay | Admitting: Advanced Practice Midwife

## 2014-05-07 DIAGNOSIS — R102 Pelvic and perineal pain: Secondary | ICD-10-CM

## 2014-05-13 ENCOUNTER — Ambulatory Visit (HOSPITAL_COMMUNITY)
Admission: RE | Admit: 2014-05-13 | Discharge: 2014-05-13 | Disposition: A | Discharge status: Home / Self Care | Payer: 59 | Source: Ambulatory Visit | Attending: Advanced Practice Midwife | Admitting: Advanced Practice Midwife

## 2014-05-13 DIAGNOSIS — R102 Pelvic and perineal pain: Secondary | ICD-10-CM | POA: Insufficient documentation

## 2014-05-13 DIAGNOSIS — N8320 Unspecified ovarian cysts: Secondary | ICD-10-CM | POA: Diagnosis not present

## 2014-05-13 DIAGNOSIS — Z90710 Acquired absence of both cervix and uterus: Secondary | ICD-10-CM | POA: Insufficient documentation

## 2014-12-22 ENCOUNTER — Other Ambulatory Visit: Payer: Self-pay

## 2014-12-22 DIAGNOSIS — Z1231 Encounter for screening mammogram for malignant neoplasm of breast: Secondary | ICD-10-CM

## 2014-12-25 ENCOUNTER — Ambulatory Visit
Admission: RE | Admit: 2014-12-25 | Discharge: 2014-12-25 | Disposition: A | Discharge status: Home / Self Care | Payer: 59 | Source: Ambulatory Visit

## 2014-12-25 DIAGNOSIS — Z1231 Encounter for screening mammogram for malignant neoplasm of breast: Secondary | ICD-10-CM

## 2015-01-23 ENCOUNTER — Other Ambulatory Visit (HOSPITAL_COMMUNITY): Payer: Self-pay | Admitting: Sports Medicine

## 2015-01-23 DIAGNOSIS — M549 Dorsalgia, unspecified: Secondary | ICD-10-CM

## 2015-01-26 ENCOUNTER — Ambulatory Visit (HOSPITAL_COMMUNITY)
Admission: RE | Admit: 2015-01-26 | Discharge: 2015-01-26 | Disposition: A | Discharge status: Home / Self Care | Payer: 59 | Source: Ambulatory Visit | Attending: Sports Medicine | Admitting: Sports Medicine

## 2015-01-26 DIAGNOSIS — M5126 Other intervertebral disc displacement, lumbar region: Secondary | ICD-10-CM | POA: Diagnosis not present

## 2015-01-26 DIAGNOSIS — M4806 Spinal stenosis, lumbar region: Secondary | ICD-10-CM | POA: Diagnosis not present

## 2015-01-26 DIAGNOSIS — M545 Low back pain: Secondary | ICD-10-CM | POA: Diagnosis present

## 2015-01-26 DIAGNOSIS — M549 Dorsalgia, unspecified: Secondary | ICD-10-CM

## 2015-02-09 ENCOUNTER — Ambulatory Visit: Payer: 59 | Attending: Sports Medicine

## 2015-02-09 DIAGNOSIS — M25651 Stiffness of right hip, not elsewhere classified: Secondary | ICD-10-CM | POA: Insufficient documentation

## 2015-02-09 DIAGNOSIS — R293 Abnormal posture: Secondary | ICD-10-CM | POA: Diagnosis present

## 2015-02-09 DIAGNOSIS — M5441 Lumbago with sciatica, right side: Secondary | ICD-10-CM | POA: Insufficient documentation

## 2015-02-09 NOTE — Therapy (Signed)
[facility] [address] [place], [place], [address] Phone: [phone]   Fax:  [phone]  Physical Therapy Evaluation  Patient Details  Name: [name] MRN: [id number] Date of Birth: [birth year] Referring Provider:  Berle Mull, MD  Encounter Date: 02/09/2015      PT End of Session - 02/09/15 1059    Visit Number 1   Date for PT Re-Evaluation 04/06/15   PT Start Time 0931   PT Stop Time 1011   PT Time Calculation (min) 40 min   Activity Tolerance Patient tolerated treatment well   Behavior During Therapy [facility] for tasks assessed/performed      Past Medical History  Diagnosis Date  . IBS (irritable bowel syndrome)   . Hiatal hernia   . CMV (cytomegalovirus infection)     Past Surgical History  Procedure Laterality Date  . Drug induced hepatitis      There were no vitals filed for this visit.  Visit Diagnosis:  Right-sided low back pain with right-sided sciatica - Plan: PT plan of care cert/re-cert  Posture imbalance - Plan: PT plan of care cert/re-cert  Hip stiffness, right - Plan: PT plan of care cert/re-cert      Subjective Assessment - 02/09/15 0938    Subjective Pt presents to PT with complaints of Rt buttock and Rt leg pain that began 01/17/15.  Pt bent over to pick up a leaf and heard a pop.  Pt had injection and is scheduled for another 02/20/15.     Pertinent History 2 neck surgeries   Limitations Sitting;Standing;Walking   How long can you sit comfortably? 1-2 hours   How long can you stand comfortably? 5 minutes   How long can you walk comfortably? 25 minutes with use of cart in grocery store- reduced velocity, pain after   Diagnostic tests see attached imaging- shallow disc bulge L4-5 with stenosis, Rt lateral recess and foraminal protrusion L3-4   Patient Stated Goals reduce pain, drive without pain, walk and stand longer   Currently in Pain? Yes   Pain Score 5    Pain Location  Sacrum   Pain Orientation Right   Pain Descriptors / Indicators Burning;Throbbing   Pain Type Acute pain   Pain Radiating Towards Rt LE/hip   Pain Onset More than a month ago   Pain Frequency Constant   Aggravating Factors  driving, walking/standing long, picking up objects, carrying a bag on the Rt shoulder   Pain Relieving Factors pain medication, ice, sitting   Multiple Pain Sites No            [facility] PT Assessment - 02/09/15 0001    Assessment   Medical Diagnosis Lumbosacral strain   Onset Date/Surgical Date 01/17/15   Next MD Visit 02/19/15   Precautions   Precautions None   Balance Screen   Has the patient fallen in the past 6 months No   Has the patient had a decrease in activity level because of a fear of falling?  No   Is the patient reluctant to leave their home because of a fear of falling?  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   Leisure walk for exercise,  flower arranging   Cognition   Overall Cognitive Status Within Functional Limits for tasks assessed   Observation/Other Assessments   Focus on Therapeutic Outcomes (FOTO)  63% limitation   Posture/Postural Control   Posture/Postural Control Postural limitations   Postural Limitations Flexed trunk;Weight shift left   Posture Comments Rt pelvic shift and Lt trunk   ROM / Strength   AROM / PROM / Strength AROM;PROM;Strength   AROM   Overall AROM  Within functional limits for tasks performed   Overall AROM Comments Lumbar AROM is full with Rt SI/lumbar pain with Rt sidebending and extension   PROM   Overall PROM  Deficits   Overall PROM Comments Rt hip flexibility limited by 25% vs the Lt    Strength   Overall Strength Deficits   Overall Strength Comments Rt  hip and knee 4+/5, Lt hip and knee 5/5   Palpation   Spinal mobility reduced segmental mobility by 25% in the lumbar spine   SI assessment  Reduced mobility on the Rt SI joint   Palpation comment Rt gluteal stiffness and pain with deep palpation   Ambulation/Gait   Ambulation/Gait Yes   Ambulation Distance (Feet) 100 Feet   Assistive device Straight cane   Gait Pattern Step-through pattern;Decreased stance time - right                           PT Education - 02/09/15 1007    Education provided Yes   Education Details HEP: single knee to chest, diagonal knee to chest, pelvic correction   Person(s) Educated Patient   Methods Explanation;Demonstration;Handout   Comprehension Verbalized understanding;Returned demonstration          PT Short Term Goals - 02/09/15 1109    PT SHORT TERM GOAL #1   Title be independent in initial HEP   Time 4   Period Weeks   Status New   PT SHORT TERM GOAL #2   Title report a 30% reduction in Rt hip/LE pain with standing activity   Time 4   Period Weeks   Status New   PT SHORT TERM GOAL #3   Title stand with neutral posture >75% of the time   Time 4   Period Weeks   Status New           PT Long Term Goals - 02/09/15 0930    PT LONG TERM GOAL #1   Title be independent in advanced HEP   Time 8   Period Weeks   Status New   PT LONG TERM GOAL #2   Title reduce FOTO to < or = to 42% limitation   Time 8   Period Weeks   Status New   PT LONG TERM GOAL #3   Title wean from cane for all distances   Time 8   Period Weeks   Status New   PT LONG TERM GOAL #4   Title report a 70% reduction in Rt LE pain with standing and walking   Time 8   Period Weeks   Status New               Plan - 02/09/15 1100    Clinical Impression Statement Pt presents to PT with onset of Rt SI joint and anterior hip pain that began 01/2015 after bending over.  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  Pt  demonstrates Rt hip stiffness, Rt pelvic shift and painful  and limited lumbar AROM.  FOTO score is 63% limitation.  Pt will benfit from PT for body mechanics education, core strength, postural alignment and hip flexibility.     Pt will benefit from skilled therapeutic intervention in order to improve on the following deficits Decreased range of motion;Pain;Decreased endurance;Decreased activity tolerance;Decreased strength;Improper body mechanics   Rehab Potential Good   PT Frequency 2x / week   PT Duration 8 weeks   PT Treatment/Interventions ADLs/Self Care Home Management;Electrical Stimulation;Moist Heat;Cryotherapy;Traction;Ultrasound;Functional mobility training;Therapeutic activities;Therapeutic exercise;Manual techniques;Neuromuscular re-education;Passive range of motion   PT Next Visit Plan Correct pelvic shift if needed, Rt hip flexibility, strength, core strength, modalities PRN   Consulted and Agree with Plan of Care Patient         Problem List There are no active problems to display for this patient.   Aurora Rody, PT 02/09/2015, 11:12 AM  [facility] [address] [place], [place], [address] Phone: [phone]   Fax:  [phone]

## 2015-02-09 NOTE — Patient Instructions (Signed)
Knee to Chest   Lying supine, bend involved knee to chest.  Hold 20 seconds, repeat  _3__ times. Repeat with other leg. Do _3__ times per day.  Copyright  VHI. All rights reserved.  Piriformis Stretch   Lying on back, pull right knee toward opposite shoulder. Hold _20___ seconds. Repeat _3___ times. Do 3____ sessions per day.  http://gt2.exer.us/258   Copyright  VHI. All rights reserved.    Stand and put one hand on Left side of ribs and 1 hand on Rt side of pelvis.  Direct force toward the middle of your body to straighten your trunk.  Use a mirror to assist.    [facility] [address] [place], [place] [address] Phone # [phone] Fax [phone]

## 2015-02-11 ENCOUNTER — Ambulatory Visit: Payer: 59

## 2015-02-11 DIAGNOSIS — M25651 Stiffness of right hip, not elsewhere classified: Secondary | ICD-10-CM

## 2015-02-11 DIAGNOSIS — M5441 Lumbago with sciatica, right side: Secondary | ICD-10-CM

## 2015-02-11 NOTE — Therapy (Signed)
[facility] [address] [place], [place], [address] Phone: [phone]   Fax:  [phone]  Physical Therapy Treatment  Patient Details  Name: [name] MRN: [id number] Date of Birth: [birth year] Referring Provider:  Mayra Neer, MD  Encounter Date: 02/11/2015      PT End of Session - 02/11/15 1612    Visit Number 2   Date for PT Re-Evaluation 04/06/15   PT Start Time 1532   PT Stop Time 1625   PT Time Calculation (min) 53 min   Activity Tolerance Patient tolerated treatment well   Behavior During Therapy [facility] for tasks assessed/performed      Past Medical History  Diagnosis Date  . IBS (irritable bowel syndrome)   . Hiatal hernia   . CMV (cytomegalovirus infection)     Past Surgical History  Procedure Laterality Date  . Drug induced hepatitis      There were no vitals filed for this visit.  Visit Diagnosis:  Right-sided low back pain with right-sided sciatica  Hip stiffness, right      Subjective Assessment - 02/11/15 1536    Subjective Pt worked today.  Had to take Aleve today as it is getting to the end of the day.     Currently in Pain? Yes   Pain Score 6    Pain Location Sacrum   Pain Orientation Right   Pain Descriptors / Indicators Aching;Throbbing   Pain Type Acute pain   Pain Onset More than a month ago   Pain Frequency Constant                         [facility] Adult PT Treatment/Exercise - 02/11/15 0001    Exercises   Exercises Lumbar;Knee/Hip   Lumbar Exercises: Stretches   Active Hamstring Stretch 3 reps;20 seconds   Single Knee to Chest Stretch 3 reps;20 seconds   Single Knee to Chest Stretch Limitations diagonal knee to chest 3x20 seconds   Piriformis Stretch 3 reps;20 seconds   Knee/Hip Exercises: Aerobic   Nustep Level 2x 5 minutes  seat 8   Modalities   Modalities Electrical Stimulation;Ultrasound;Moist Heat   Moist Heat Therapy   Number Minutes  Moist Heat 15 Minutes   Moist Heat Location Lumbar Spine  Rt gluteals   Electrical Stimulation   Electrical Stimulation Location Rt gluteals and lumbar spine   Electrical Stimulation Action IFC   Electrical Stimulation Parameters 15 minutes   Electrical Stimulation Goals Pain   Ultrasound   Ultrasound Location Rt gluteals and lumbar paraspinals   Ultrasound Parameters 1.3 w/cm2 cont to Rt gluteals and lumbar spine x 8 minutes   Ultrasound Goals Pain                PT Education - 02/11/15 1552    Education provided Yes   Education Details HEP: seated hamstring and piriformis stretch   Person(s) Educated Patient   Methods Explanation;Demonstration;Handout   Comprehension Verbalized understanding;Returned demonstration          PT Short Term Goals - 02/09/15 1109    PT SHORT TERM GOAL #1   Title be independent in initial HEP   Time 4   Period Weeks   Status New   PT SHORT TERM GOAL #2   Title report a 30% reduction in Rt hip/LE pain with standing activity   Time 4   Period Weeks   Status New   PT SHORT TERM GOAL #3  Title stand with neutral posture >75% of the time   Time 4   Period Weeks   Status New           PT Long Term Goals - 02/09/15 0930    PT LONG TERM GOAL #1   Title be independent in advanced HEP   Time 8   Period Weeks   Status New   PT LONG TERM GOAL #2   Title reduce FOTO to < or = to 42% limitation   Time 8   Period Weeks   Status New   PT LONG TERM GOAL #3   Title wean from cane for all distances   Time 8   Period Weeks   Status New   PT LONG TERM GOAL #4   Title report a 70% reduction in Rt LE pain with standing and walking   Time 8   Period Weeks   Status New               Plan - 02/11/15 1538    Clinical Impression Statement Pt with improved posture today with more neutral pelvic alighment.  Pt with pain at the end of the work day.  Pt able to demo all HEP correctly.  Pt with painful mobility today with all  movement.  Pt will benefit from continued PT for flexibility, core stability and modalities to reduce pain.   Pt will benefit from skilled therapeutic intervention in order to improve on the following deficits Decreased range of motion;Pain;Decreased endurance;Decreased activity tolerance;Decreased strength;Improper body mechanics   Rehab Potential Good   PT Frequency 2x / week   PT Duration 8 weeks   PT Treatment/Interventions ADLs/Self Care Home Management;Electrical Stimulation;Moist Heat;Cryotherapy;Traction;Ultrasound;Functional mobility training;Therapeutic activities;Therapeutic exercise;Manual techniques;Neuromuscular re-education;Passive range of motion   PT Next Visit Plan Rt hip flexibility, strength, core strength and modalities   Consulted and Agree with Plan of Care Patient        Problem List There are no active problems to display for this patient.   TAKACS,KELLY, PT 02/11/2015, 4:13 PM  [facility] [address] [place], [place], [address] Phone: [phone]   Fax:  [phone]

## 2015-02-11 NOTE — Patient Instructions (Signed)
Piriformis Stretch, Sitting    Sit, one ankle on opposite knee, same-side hand on crossed knee. Push down on knee, keeping spine straight. Lean torso forward, with flat back, until tension is felt in hamstrings and gluteals of crossed-leg side. Hold _20__ seconds.  Repeat _3__ times per session. Do __3_ sessions per day.  Copyright  VHI. All rights reserved.  HIP: Hamstrings - Short Sitting    Rest leg on raised surface. Keep knee straight. Lift chest. Hold _20__ seconds. _3__ reps per set, __3_ sets per day  Copyright  VHI. All rights reserved.  [facility] [address], [address] [place], [place] [address] Phone # [phone] Fax [phone] 

## 2015-02-18 ENCOUNTER — Encounter: Payer: Self-pay | Admitting: Physical Therapy

## 2015-02-18 ENCOUNTER — Ambulatory Visit: Payer: 59 | Attending: Sports Medicine | Admitting: Physical Therapy

## 2015-02-18 DIAGNOSIS — M25651 Stiffness of right hip, not elsewhere classified: Secondary | ICD-10-CM | POA: Diagnosis present

## 2015-02-18 DIAGNOSIS — R293 Abnormal posture: Secondary | ICD-10-CM | POA: Insufficient documentation

## 2015-02-18 DIAGNOSIS — M5441 Lumbago with sciatica, right side: Secondary | ICD-10-CM | POA: Insufficient documentation

## 2015-02-18 NOTE — Therapy (Signed)
[facility] [address] [place], [place], [address] Phone: [phone]   Fax:  [phone]  Physical Therapy Treatment  Patient Details  Name: [name] MRN: [id number] Date of Birth: [birth year] Referring Provider:  Berle Mull, MD  Encounter Date: 02/18/2015      PT End of Session - 02/18/15 1539    Visit Number 3   Date for PT Re-Evaluation 04/06/15   PT Start Time 9833   PT Stop Time 1546   PT Time Calculation (min) 61 min   Activity Tolerance Patient tolerated treatment well   Behavior During Therapy [facility] for tasks assessed/performed      Past Medical History  Diagnosis Date  . IBS (irritable bowel syndrome)   . Hiatal hernia   . CMV (cytomegalovirus infection)     Past Surgical History  Procedure Laterality Date  . Drug induced hepatitis      There were no vitals filed for this visit.  Visit Diagnosis:  Right-sided low back pain with right-sided sciatica  Hip stiffness, right  Posture imbalance      Subjective Assessment - 02/18/15 1528    Subjective Pt worked today and her pain was up at a 7/10 she had to take Aleve. She is looking forward for the scheduled shot on Friday.   Pertinent History 2 neck surgeries   Limitations Sitting;Standing;Walking   How long can you sit comfortably? 1-2 hours   How long can you stand comfortably? 5 minutes   How long can you walk comfortably? 25 minutes with use of cart in grocery store- reduced velocity, pain after   Diagnostic tests see attached imaging- shallow disc bulge L4-5 with stenosis, Rt lateral recess and foraminal protrusion L3-4   Patient Stated Goals reduce pain, drive without pain, walk and stand longer   Currently in Pain? Yes   Pain Score 7    Pain Location Sacrum   Pain Orientation Right   Pain Descriptors / Indicators Aching;Throbbing   Pain Type Acute pain   Pain Radiating Towards Rt hip and rt groin and front of thight   Pain  Onset More than a month ago   Multiple Pain Sites No                         [facility] Adult PT Treatment/Exercise - 02/18/15 0001    Bed Mobility   Bed Mobility Right Sidelying to Sit;Supine to Sit  practiced log rolling    Exercises   Exercises Lumbar;Knee/Hip   Lumbar Exercises: Stretches   Active Hamstring Stretch 3 reps;20 seconds   Single Knee to Chest Stretch 3 reps;20 seconds   Single Knee to Chest Stretch Limitations diagonal knee to chest 3x20 seconds   Piriformis Stretch 3 reps;20 seconds   Knee/Hip Exercises: Aerobic   Nustep Level 2x 6 minutes  seat #8   Modalities   Modalities Electrical Stimulation;Ultrasound;Moist Heat   Moist Heat Therapy   Number Minutes Moist Heat 15 Minutes   Moist Heat Location Lumbar Spine  Rt gluteus   Electrical Stimulation   Electrical Stimulation Location Rt gluteals and lumbar spine  in left sidelying   Electrical Stimulation Action IFC   Electrical Stimulation Parameters 15   Electrical Stimulation Goals Pain   Ultrasound   Ultrasound Location Rt gluteal   Ultrasound Parameters 100%, 1W/cm, !Mhz x 73min with biofreeze   Ultrasound Goals Pain  PT Short Term Goals - 02/18/15 1547    PT SHORT TERM GOAL #1   Title be independent in initial HEP   Time 4   Period Weeks   Status On-going   PT SHORT TERM GOAL #2   Title report a 30% reduction in Rt hip/LE pain with standing activity   Time 4   Period Weeks   Status On-going   PT SHORT TERM GOAL #3   Title stand with neutral posture >75% of the time   Time 4   Period Weeks   Status On-going           PT Long Term Goals - 02/18/15 1548    PT LONG TERM GOAL #1   Title be independent in advanced HEP   Time 8   Period Weeks   Status On-going   PT LONG TERM GOAL #2   Title reduce FOTO to < or = to 42% limitation   Time 8   Period Weeks   Status On-going   PT LONG TERM GOAL #3   Title wean from cane for all distances   Time 8    Period Weeks   Status On-going   PT LONG TERM GOAL #4   Title report a 70% reduction in Rt LE pain with standing and walking   Time 8   Period Weeks   Status On-going               Plan - 02/18/15 1539    Clinical Impression Statement Pt with difficulites to bend to pick up her purse, and with guarded movements due to increase of pain today. Pt was able to work but needed to take pain meds. Pt will continue to benefit from skilled PT to advance stretching and  start pootural and core strength    Pt will benefit from skilled therapeutic intervention in order to improve on the following deficits Decreased range of motion;Pain;Decreased endurance;Decreased activity tolerance;Decreased strength;Improper body mechanics   Rehab Potential Good   PT Frequency 2x / week   PT Duration 8 weeks   PT Treatment/Interventions ADLs/Self Care Home Management;Electrical Stimulation;Moist Heat;Cryotherapy;Traction;Ultrasound;Functional mobility training;Therapeutic activities;Therapeutic exercise;Manual techniques;Neuromuscular re-education;Passive range of motion   PT Next Visit Plan Rt hip flexibility, strength, core strength and modalities   Consulted and Agree with Plan of Care Patient        Problem List There are no active problems to display for this patient.   [name],Shandria Clinch PTA 02/18/2015, 5:26 PM  [facility] [address] [place], [place], [address] Phone: [phone]   Fax:  [phone]

## 2015-02-24 ENCOUNTER — Ambulatory Visit: Payer: 59

## 2015-02-24 DIAGNOSIS — M25651 Stiffness of right hip, not elsewhere classified: Secondary | ICD-10-CM

## 2015-02-24 DIAGNOSIS — M5441 Lumbago with sciatica, right side: Secondary | ICD-10-CM

## 2015-02-24 NOTE — Therapy (Signed)
[facility] [address] [place], [place], [address] Phone: [phone]   Fax:  [phone]  Physical Therapy Treatment  Patient Details  Name: [name] MRN: [id number] Date of Birth: 12-04-1960 Referring Provider:  Berle Mull, MD  Encounter Date: 02/24/2015      PT End of Session - 02/24/15 1526    Visit Number 4   Date for PT Re-Evaluation 04/06/15   PT Start Time 6759   PT Stop Time 1540   PT Time Calculation (min) 56 min   Activity Tolerance Patient tolerated treatment well   Behavior During Therapy [facility] for tasks assessed/performed      Past Medical History  Diagnosis Date  . IBS (irritable bowel syndrome)   . Hiatal hernia   . CMV (cytomegalovirus infection)     Past Surgical History  Procedure Laterality Date  . Drug induced hepatitis      There were no vitals filed for this visit.  Visit Diagnosis:  Right-sided low back pain with right-sided sciatica  Hip stiffness, right      Subjective Assessment - 02/24/15 1445    Subjective Pt had injection on Friday and has not been at work, just taking it easy.     Currently in Pain? Yes   Pain Score 3    Pain Location Back   Pain Orientation Right   Pain Descriptors / Indicators Aching;Throbbing   Pain Type Acute pain   Pain Onset More than a month ago   Pain Frequency Constant   Aggravating Factors  rotation at the spine, standing in one place, walking too long   Pain Relieving Factors pain medication, injection, ice, sitting                         [facility] Adult PT Treatment/Exercise - 02/24/15 0001    Lumbar Exercises: Stretches   Single Knee to Chest Stretch 3 reps;20 seconds   Single Knee to Chest Stretch Limitations diagonal knee to chest 3x20 seconds   Lumbar Exercises: Supine   Ab Set 20 reps   AB Set Limitations transversus ab training and pelvic floor contraction-leg motions added   Knee/Hip Exercises:  Aerobic   Nustep Level 2x 8 minutes   Moist Heat Therapy   Number Minutes Moist Heat 15 Minutes   Moist Heat Location Lumbar Spine  Rt gluteus   Electrical Stimulation   Electrical Stimulation Location Rt gluteals and lumbar spine  in left sidelying   Electrical Stimulation Action IFC   Electrical Stimulation Parameters 15   Electrical Stimulation Goals Pain                PT Education - 02/24/15 1511    Education provided Yes   Education Details Trans Ab contraction and leg motion   Person(s) Educated Patient   Methods Explanation;Demonstration;Handout   Comprehension Verbalized understanding;Returned demonstration          PT Short Term Goals - 02/24/15 1451    PT SHORT TERM GOAL #1   Title be independent in initial HEP   Status Achieved   PT SHORT TERM GOAL #2   Title report a 30% reduction in Rt hip/LE pain with standing activity   Time 4   Period Weeks   Status Achieved  since injection   PT SHORT TERM GOAL #3   Title stand with neutral posture >75% of the time   Status Achieved  standing neutral  PT Long Term Goals - 02/18/15 1548    PT LONG TERM GOAL #1   Title be independent in advanced HEP   Time 8   Period Weeks   Status On-going   PT LONG TERM GOAL #2   Title reduce FOTO to < or = to 42% limitation   Time 8   Period Weeks   Status On-going   PT LONG TERM GOAL #3   Title wean from cane for all distances   Time 8   Period Weeks   Status On-going   PT LONG TERM GOAL #4   Title report a 70% reduction in Rt LE pain with standing and walking   Time 8   Period Weeks   Status On-going               Plan - 02/24/15 1452    Clinical Impression Statement Pt reports 50% reduction in pain since injection on Friday.  Pt has been resting/off work since 3 days ago.  Pt is now standing with neutral posture and no substitution is noted. PT educated pt in core strength exercises and verbal/tactile cues needed while learing this  exercise.  Pt is independent with HEP and able to demo correctly.  Pt will continue to benefit from skilled PT as endurance is limited and pain is limiting function.     Pt will benefit from skilled therapeutic intervention in order to improve on the following deficits Decreased range of motion;Pain;Decreased endurance;Decreased activity tolerance;Decreased strength;Improper body mechanics   Rehab Potential Good   PT Frequency 2x / week   PT Duration 8 weeks   PT Treatment/Interventions ADLs/Self Care Home Management;Electrical Stimulation;Moist Heat;Cryotherapy;Traction;Ultrasound;Functional mobility training;Therapeutic activities;Therapeutic exercise;Manual techniques;Neuromuscular re-education;Passive range of motion   PT Next Visit Plan Rt hip flexibility, strength, core strength and modalities. Review transversus HEP   Consulted and Agree with Plan of Care Patient        Problem List There are no active problems to display for this patient.   TAKACS,KELLY, PT 02/24/2015, 3:27 PM  [facility] [address], [place], [place], [address] Phone: [phone]   Fax:  [phone]

## 2015-02-24 NOTE — Patient Instructions (Signed)
Lower abdominal/core stability exercises  1. Practice your breathing technique: Inhale through your nose expanding your belly and rib cage. Try not to breathe into your chest. Exhale slowly and gradually out your mouth feeling a sense of softness to your body. Practice multiple times. This can be performed unlimited.  2. Finding the lower abdominals. Laying on your back with the knees bent, place your fingers just below your belly button. Using your breathing technique from above, on your exhale gently pull the belly button away from your fingertips without tensing any other muscles. Practice this 5x. Next, as you exhale, draw belly button inwards and hold onto it...then feel as if you are pulling that muscle across your pelvis like you are tightening a belt. This can be hard to do at first so be patient and practice. Do 5-10 reps 1-3 x day. Always recognize quality over quantity; if your abdominal muscles become tired you will notice you may tighten/contract other muscles. This is the time to take a break.   Practice this first laying on your back, then in sitting, progressing to standing and finally adding it to all your daily movements.   3. Finding your pelvic floor. Using the breathing technique above, when your exhale, this time draw your pelvic floor muscles up as if you were attempting to stop the flow of urination. Be careful NOT to tense any other muscles. This can be hard, BE PATIENT. Try to hold up to 10 seconds repeating 10x. Try 2x a day. Once you feel you are doing this well, add this contraction to exercise #2. First contracting your pelvic floor followed by lower abdominals.   4. Adding leg movements. Add the following leg movements to challenge your ability to keep your core stable:  1. Single leg drop outs: Laying on your back with knees bent feet flat. Inhale,  dropping one knee outward KEEPING YOUR PELVIS STILL. Exhale as you bring the leg back, simultaneously performing your lower  abdominal contraction. Do 5-10 on each leg.   2. Marching: While keeping your pelvis still, lift the right foot a few inches, put it down then lift left foot. This will mimic a march. Start slow to establish control. Once you have control you may speed it up. Do 10-20x. You MUST keep your lower abdominlas contracted while you march. Breathe naturally      [facility] [address], [address] [place], [place] [address] Phone # [phone] Fax [phone] 

## 2015-02-26 ENCOUNTER — Encounter: Payer: Self-pay | Admitting: Physical Therapy

## 2015-02-26 ENCOUNTER — Ambulatory Visit: Payer: 59 | Admitting: Physical Therapy

## 2015-02-26 DIAGNOSIS — M5441 Lumbago with sciatica, right side: Secondary | ICD-10-CM | POA: Diagnosis not present

## 2015-02-26 DIAGNOSIS — R293 Abnormal posture: Secondary | ICD-10-CM

## 2015-02-26 DIAGNOSIS — M25651 Stiffness of right hip, not elsewhere classified: Secondary | ICD-10-CM

## 2015-02-26 NOTE — Therapy (Signed)
[facility] [address], [place], [place], [address] Phone: [phone]   Fax:  [phone]  Physical Therapy Treatment  Patient Details  Name: [name] MRN: [id number] Date of Birth: [birth year] Referring Provider:  Berle Mull, MD  Encounter Date: 02/26/2015      PT End of Session - 02/26/15 1509    Visit Number 5   Date for PT Re-Evaluation 04/06/15   PT Start Time 1448   PT Stop Time 1546   PT Time Calculation (min) 58 min   Activity Tolerance Patient tolerated treatment well   Behavior During Therapy [facility] for tasks assessed/performed      Past Medical History  Diagnosis Date  . IBS (irritable bowel syndrome)   . Hiatal hernia   . CMV (cytomegalovirus infection)     Past Surgical History  Procedure Laterality Date  . Drug induced hepatitis      There were no vitals filed for this visit.  Visit Diagnosis:  Right-sided low back pain with right-sided sciatica  Hip stiffness, right  Posture imbalance      Subjective Assessment - 02/26/15 1454    Subjective Pt was bending over yesterday, not considering her back and was hurting used ALEVE TENS Unit to help control the pain   Currently in Pain? Yes   Pain Score 4    Pain Location Back   Pain Orientation Right   Pain Descriptors / Indicators Aching;Throbbing   Pain Type Acute pain   Pain Onset More than a month ago   Pain Frequency Constant   Aggravating Factors  sudden bending, rotation at the spine, standing in one place, walking too long   Multiple Pain Sites No                         [facility] Adult PT Treatment/Exercise - 02/26/15 0001    Exercises   Exercises Lumbar;Knee/Hip   Lumbar Exercises: Stretches   Active Hamstring Stretch 3 reps;20 seconds  in supine   Single Knee to Chest Stretch 3 reps;20 seconds   Single Knee to Chest Stretch Limitations diagonal knee to chest 3x20 seconds   Piriformis Stretch --   Lumbar Exercises: Supine   Ab Set 20 reps   AB Set Limitations transversus ab training and pelvic floor contraction-leg motions added   Knee/Hip Exercises: Aerobic   Nustep Level 2x 8 minutes  seat #8, arms # 6   Modalities   Modalities Electrical Stimulation   Moist Heat Therapy   Number Minutes Moist Heat 15 Minutes   Moist Heat Location Lumbar Spine;Other (comment)  Rt gluteus   Electrical Stimulation   Electrical Stimulation Location Rt gluteals and lumbar spine   Electrical Stimulation Action IFC   Electrical Stimulation Parameters 15   Electrical Stimulation Goals Pain                  PT Short Term Goals - 02/24/15 1451    PT SHORT TERM GOAL #1   Title be independent in initial HEP   Status Achieved   PT SHORT TERM GOAL #2   Title report a 30% reduction in Rt hip/LE pain with standing activity   Time 4   Period Weeks   Status Achieved  since injection   PT SHORT TERM GOAL #3   Title stand with neutral posture >75% of the time   Status Achieved  standing neutral  PT Long Term Goals - 02/18/15 1548    PT LONG TERM GOAL #1   Title be independent in advanced HEP   Time 8   Period Weeks   Status On-going   PT LONG TERM GOAL #2   Title reduce FOTO to < or = to 42% limitation   Time 8   Period Weeks   Status On-going   PT LONG TERM GOAL #3   Title wean from cane for all distances   Time 8   Period Weeks   Status On-going   PT LONG TERM GOAL #4   Title report a 70% reduction in Rt LE pain with standing and walking   Time 8   Period Weeks   Status On-going               Plan - 02/26/15 1510    Clinical Impression Statement Pt with increase tightness today as noted with stretching exercises today due to bending yesterday. 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 Pt will continue to benefit from skilled PT to address pain.      Rehab Potential Good   PT Frequency 2x / week   PT Duration 8 weeks    PT Treatment/Interventions ADLs/Self Care Home Management;Electrical Stimulation;Moist Heat;Cryotherapy;Traction;Ultrasound;Functional mobility training;Therapeutic activities;Therapeutic exercise;Manual techniques;Neuromuscular re-education;Passive range of motion   PT Next Visit Plan continue with Rt hip flexibility, TA activation, modalities   Consulted and Agree with Plan of Care Patient        Problem List There are no active problems to display for this patient.   Powell,Sylvia Mundo PTA 02/26/2015, 3:22 PM  [facility] [address] [place], [place], [address] Phone: [phone]   Fax:  [phone]

## 2015-03-03 ENCOUNTER — Encounter: Payer: Self-pay | Admitting: Physical Therapy

## 2015-03-03 ENCOUNTER — Ambulatory Visit: Payer: 59 | Admitting: Physical Therapy

## 2015-03-03 DIAGNOSIS — M5441 Lumbago with sciatica, right side: Secondary | ICD-10-CM

## 2015-03-03 DIAGNOSIS — M25651 Stiffness of right hip, not elsewhere classified: Secondary | ICD-10-CM

## 2015-03-03 DIAGNOSIS — R293 Abnormal posture: Secondary | ICD-10-CM

## 2015-03-03 NOTE — Therapy (Signed)
[facility] [address], [place], [place], [address] Phone: [phone]   Fax:  [phone]  Physical Therapy Treatment  Patient Details  Name: [name] MRN: [id number] Date of Birth: November 13, 1960 Referring Provider:  Berle Mull, MD  Encounter Date: 03/03/2015      PT End of Session - 03/03/15 1514    Visit Number 6   Date for PT Re-Evaluation 04/06/15   PT Start Time 7915   PT Stop Time 1547   PT Time Calculation (min) 62 min   Activity Tolerance Patient tolerated treatment well   Behavior During Therapy [facility] for tasks assessed/performed      Past Medical History  Diagnosis Date  . IBS (irritable bowel syndrome)   . Hiatal hernia   . CMV (cytomegalovirus infection)     Past Surgical History  Procedure Laterality Date  . Drug induced hepatitis      There were no vitals filed for this visit.  Visit Diagnosis:  Right-sided low back pain with right-sided sciatica  Hip stiffness, right  Posture imbalance      Subjective Assessment - 03/03/15 1457    Subjective The back feels tight and the legs feel fatique, pt was at work today.   Currently in Pain? Yes   Pain Score 6    Pain Location Back   Pain Orientation Right   Pain Descriptors / Indicators Aching;Tightness   Pain Type Chronic pain   Pain Onset More than a month ago   Multiple Pain Sites No                         [facility] Adult PT Treatment/Exercise - 03/03/15 0001    Exercises   Exercises Lumbar   Lumbar Exercises: Stretches   Active Hamstring Stretch 3 reps;20 seconds  in sitting   Single Knee to Chest Stretch 3 reps;20 seconds   Single Knee to Chest Stretch Limitations diagonal knee to chest 3x20 seconds   Piriformis Stretch 3 reps;20 seconds  each leg in sitting   Lumbar Exercises: Seated   Other Seated Lumbar Exercises pelvic tilt in sitting ant/post   Lumbar Exercises: Supine   Ab Set --   AB Set  Limitations --   Other Supine Lumbar Exercises Foam roll for elongation and decompression x 2 min   Lumbar Exercises: Prone   Other Prone Lumbar Exercises Prayer position x 2 with prolonged hold    Knee/Hip Exercises: Aerobic   Nustep Level 2x 8 minutes  seat and arms # 8   Modalities   Modalities Electrical Stimulation   Moist Heat Therapy   Number Minutes Moist Heat 15 Minutes   Moist Heat Location Lumbar Spine;Other (comment)   Acupuncturist Location Rt gluteals and lumbar spine   Electrical Stimulation Action IFC   Electrical Stimulation Parameters 15   Electrical Stimulation Goals Pain   Manual Therapy   Manual Therapy Soft tissue mobilization   Soft tissue mobilization in left sidelying ot Rt QL and lumbar paraspinals                  PT Short Term Goals - 02/24/15 1451    PT SHORT TERM GOAL #1   Title be independent in initial HEP   Status Achieved   PT SHORT TERM GOAL #2   Title report a 30% reduction in Rt hip/LE pain with standing activity   Time 4   Period Weeks  Status Achieved  since injection   PT SHORT TERM GOAL #3   Title stand with neutral posture >75% of the time   Status Achieved  standing neutral           PT Long Term Goals - 03/03/15 1518    PT LONG TERM GOAL #1   Title be independent in advanced HEP   Time 8   Period Weeks   Status On-going   PT LONG TERM GOAL #2   Title reduce FOTO to < or = to 42% limitation   Time 8   Period Weeks   Status On-going   PT LONG TERM GOAL #3   Title wean from cane for all distances  only for long distance   Time 8   Period Weeks   Status Partially Met   PT LONG TERM GOAL #4   Title report a 70% reduction in Rt LE pain with standing and walking   Time 8   Period Weeks   Status On-going               Plan - 03/03/15 1516    Clinical Impression Statement Pt continues to present with tightness in  low and mid back. Pt felt reliev after stretching  exercises. Pt will continue to benefit from skilled PT   Pt will benefit from skilled therapeutic intervention in order to improve on the following deficits Decreased range of motion;Pain;Decreased endurance;Decreased activity tolerance;Decreased strength;Improper body mechanics   Rehab Potential Good   PT Frequency 2x / week   PT Duration 8 weeks   PT Treatment/Interventions ADLs/Self Care Home Management;Electrical Stimulation;Moist Heat;Cryotherapy;Traction;Ultrasound;Functional mobility training;Therapeutic activities;Therapeutic exercise;Manual techniques;Neuromuscular re-education;Passive range of motion   PT Next Visit Plan continue with stretching for hip, lumbar and mid back, modalities   Consulted and Agree with Plan of Care Patient        Problem List There are no active problems to display for this patient.   [name],ELKE PTA 03/03/2015, 3:43 PM  [facility] [address], [place], [place], [address] Phone: [phone]   Fax:  [phone]

## 2015-03-05 ENCOUNTER — Encounter: Payer: Self-pay | Admitting: Physical Therapy

## 2015-03-05 ENCOUNTER — Ambulatory Visit: Payer: 59 | Admitting: Physical Therapy

## 2015-03-05 DIAGNOSIS — M5441 Lumbago with sciatica, right side: Secondary | ICD-10-CM

## 2015-03-05 DIAGNOSIS — M25651 Stiffness of right hip, not elsewhere classified: Secondary | ICD-10-CM

## 2015-03-05 DIAGNOSIS — R293 Abnormal posture: Secondary | ICD-10-CM

## 2015-03-05 NOTE — Therapy (Signed)
[facility] [address], [place], [place], [address] Phone: [phone]   Fax:  [phone]  Physical Therapy Treatment  Patient Details  Name: [name] MRN: [id number] Date of Birth: [birth year] Referring Provider:  Berle Mull, MD  Encounter Date: 03/05/2015      PT End of Session - 03/05/15 1518    Visit Number 7   Date for PT Re-Evaluation 04/06/15   PT Start Time 5916   PT Stop Time 1542   PT Time Calculation (min) 57 min   Activity Tolerance Patient tolerated treatment well   Behavior During Therapy [facility] for tasks assessed/performed      Past Medical History  Diagnosis Date  . IBS (irritable bowel syndrome)   . Hiatal hernia   . CMV (cytomegalovirus infection)     Past Surgical History  Procedure Laterality Date  . Drug induced hepatitis      There were no vitals filed for this visit.  Visit Diagnosis:  Right-sided low back pain with right-sided sciatica  Hip stiffness, right  Posture imbalance      Subjective Assessment - 03/05/15 1454    Subjective It is the same still feels tight in mid and low back, bil hips painful today. Pt used TENS unit today at work.    Currently in Pain? Yes   Pain Score 4    Pain Location Back   Pain Orientation Right   Pain Descriptors / Indicators Aching;Tightness   Pain Type Chronic pain   Pain Onset More than a month ago   Pain Frequency Constant   Multiple Pain Sites No                         [facility] Adult PT Treatment/Exercise - 03/05/15 0001    Exercises   Exercises Lumbar   Lumbar Exercises: Stretches   Active Hamstring Stretch 3 reps;20 seconds  in sitting   Single Knee to Chest Stretch 3 reps;20 seconds   Single Knee to Chest Stretch Limitations diagonal knee to chest 3x20 seconds   Piriformis Stretch 3 reps;20 seconds  each leg in sitting   Lumbar Exercises: Aerobic   UBE (Upper Arm Bike) L 1 x 84mn (3/30 sitting on  green physioball   Lumbar Exercises: Seated   Other Seated Lumbar Exercises pelvic tilt in sitting ant/post   Lumbar Exercises: Supine   Other Supine Lumbar Exercises Foam roll for elongation and decompression x 2 min  towel under head for support   Lumbar Exercises: Prone   Other Prone Lumbar Exercises Prayer position x 3 with 20 sec hold, pillow b/w knees    Modalities   Modalities Electrical Stimulation   Moist Heat Therapy   Number Minutes Moist Heat 15 Minutes   Moist Heat Location Lumbar Spine;Other (comment)   EAcupuncturistLocation Rt gluteals and lumbar spine   Electrical Stimulation Action IFC   Electrical Stimulation Parameters 15   Electrical Stimulation Goals Pain                  PT Short Term Goals - 02/24/15 1451    PT SHORT TERM GOAL #1   Title be independent in initial HEP   Status Achieved   PT SHORT TERM GOAL #2   Title report a 30% reduction in Rt hip/LE pain with standing activity   Time 4   Period Weeks   Status Achieved  since injection  PT SHORT TERM GOAL #3   Title stand with neutral posture >75% of the time   Status Achieved  standing neutral           PT Long Term Goals - 03/03/15 1518    PT LONG TERM GOAL #1   Title be independent in advanced HEP   Time 8   Period Weeks   Status On-going   PT LONG TERM GOAL #2   Title reduce FOTO to < or = to 42% limitation   Time 8   Period Weeks   Status On-going   PT LONG TERM GOAL #3   Title wean from cane for all distances  only for long distance   Time 8   Period Weeks   Status Partially Met   PT LONG TERM GOAL #4   Title report a 70% reduction in Rt LE pain with standing and walking   Time 8   Period Weeks   Status On-going               Plan - 03/05/15 1520    Clinical Impression Statement Pt continues to present with tightness in low back. Pt with improvement of technique with stretching exercises. Pt will continue to benefit  from PT.     Pt will benefit from skilled therapeutic intervention in order to improve on the following deficits Decreased range of motion;Pain;Decreased endurance;Decreased activity tolerance;Decreased strength;Improper body mechanics   Rehab Potential Good   PT Frequency 2x / week   PT Duration 8 weeks   PT Treatment/Interventions ADLs/Self Care Home Management;Electrical Stimulation;Moist Heat;Cryotherapy;Traction;Ultrasound;Functional mobility training;Therapeutic activities;Therapeutic exercise;Manual techniques;Neuromuscular re-education;Passive range of motion   PT Next Visit Plan continue with stretching for hip, lumbar and mid back, may do STW to low back, modalities   Consulted and Agree with Plan of Care Patient        Problem List There are no active problems to display for this patient.   [name],Kevyn Boquet PTA 03/05/2015, 3:32 PM  [facility] [address], [place], [place], [address] Phone: [phone]   Fax:  [phone]

## 2015-03-10 ENCOUNTER — Encounter: Payer: Self-pay | Admitting: Physical Therapy

## 2015-03-10 ENCOUNTER — Ambulatory Visit: Payer: 59 | Admitting: Physical Therapy

## 2015-03-10 DIAGNOSIS — R293 Abnormal posture: Secondary | ICD-10-CM

## 2015-03-10 DIAGNOSIS — M5441 Lumbago with sciatica, right side: Secondary | ICD-10-CM

## 2015-03-10 DIAGNOSIS — M25651 Stiffness of right hip, not elsewhere classified: Secondary | ICD-10-CM

## 2015-03-10 NOTE — Therapy (Signed)
[facility] [address] [place], [place], [address] Phone: [phone]   Fax:  [phone]  Physical Therapy Treatment  Patient Details  Name: [name] MRN: [id number] Date of Birth: 1960/09/23 Referring Provider:  Berle Mull, MD  Encounter Date: 03/10/2015      PT End of Session - 03/10/15 1511    Visit Number 8   Date for PT Re-Evaluation 04/06/15   PT Start Time 1444   PT Stop Time 1545   PT Time Calculation (min) 61 min   Activity Tolerance Patient tolerated treatment well   Behavior During Therapy [facility] for tasks assessed/performed      Past Medical History  Diagnosis Date  . IBS (irritable bowel syndrome)   . Hiatal hernia   . CMV (cytomegalovirus infection)     Past Surgical History  Procedure Laterality Date  . Drug induced hepatitis      There were no vitals filed for this visit.  Visit Diagnosis:  Right-sided low back pain with right-sided sciatica  Hip stiffness, right  Posture imbalance      Subjective Assessment - 03/10/15 1456    Subjective Today I was able to sit for prolonged time at a meeting, but of course used any situation to change positions. Was able to sleep this last nigth without meds but sleep was not very restful.    Pertinent History 2 neck surgeries   Currently in Pain? Yes   Pain Score 2    Pain Location Back   Pain Orientation Right   Pain Descriptors / Indicators Aching;Tightness   Pain Type Chronic pain   Pain Onset More than a month ago   Pain Frequency Constant   Multiple Pain Sites No                         [facility] Adult PT Treatment/Exercise - 03/10/15 0001    Exercises   Exercises Lumbar   Lumbar Exercises: Stretches   Active Hamstring Stretch 3 reps;20 seconds   Single Knee to Chest Stretch 3 reps;20 seconds   Single Knee to Chest Stretch Limitations diagonal knee to chest 3x20 seconds   Lower Trunk Rotation 3 reps;20 seconds    Piriformis Stretch 3 reps;20 seconds   Lumbar Exercises: Standing   Other Standing Lumbar Exercises leaning against red physioball -red t-band into bil abd 2 x 10   Other Standing Lumbar Exercises squatting x 10 with red physioball    Lumbar Exercises: Prone   Other Prone Lumbar Exercises Prayer position x 3 with 20 sec hold, pillow b/w knees   pillow under knees and sitting on it   Knee/Hip Exercises: Aerobic   Nustep Level 2x 8 minutes  seat & arms #8   Modalities   Modalities Electrical Stimulation   Moist Heat Therapy   Number Minutes Moist Heat 15 Minutes   Moist Heat Location Lumbar Spine;Other (comment)   Acupuncturist Location Rt gluteals and lumbar spine   Electrical Stimulation Action IFC   Electrical Stimulation Parameters 15   Electrical Stimulation Goals Pain                  PT Short Term Goals - 02/24/15 1451    PT SHORT TERM GOAL #1   Title be independent in initial HEP   Status Achieved   PT SHORT TERM GOAL #2   Title report a 30% reduction in Rt hip/LE pain with  standing activity   Time 4   Period Weeks   Status Achieved  since injection   PT SHORT TERM GOAL #3   Title stand with neutral posture >75% of the time   Status Achieved  standing neutral           PT Long Term Goals - 03/03/15 1518    PT LONG TERM GOAL #1   Title be independent in advanced HEP   Time 8   Period Weeks   Status On-going   PT LONG TERM GOAL #2   Title reduce FOTO to < or = to 42% limitation   Time 8   Period Weeks   Status On-going   PT LONG TERM GOAL #3   Title wean from cane for all distances  only for long distance   Time 8   Period Weeks   Status Partially Met   PT LONG TERM GOAL #4   Title report a 70% reduction in Rt LE pain with standing and walking   Time 8   Period Weeks   Status On-going               Plan - 03/10/15 1511    Clinical Impression Statement Pt continues to present with tighness in  low back, but improving. Pt will continue to benefit from Pt to held with stretching and strengthening   Pt will benefit from skilled therapeutic intervention in order to improve on the following deficits Decreased range of motion;Pain;Decreased endurance;Decreased activity tolerance;Decreased strength;Improper body mechanics   Rehab Potential Good   PT Frequency 2x / week   PT Duration 8 weeks   PT Treatment/Interventions ADLs/Self Care Home Management;Electrical Stimulation;Moist Heat;Cryotherapy;Traction;Ultrasound;Functional mobility training;Therapeutic activities;Therapeutic exercise;Manual techniques;Neuromuscular re-education;Passive range of motion   PT Next Visit Plan Continue with stretching esspecially prayer stretch. may add softtissue work to low back, modalities   Consulted and Agree with Plan of Care Patient        Problem List There are no active problems to display for this patient.   [name],[name] PTA 03/10/2015, 3:30 PM  [facility] [address], [place], [place], [address] Phone: [phone]   Fax:  [phone]

## 2015-03-12 ENCOUNTER — Encounter: Payer: Self-pay | Admitting: Physical Therapy

## 2015-03-12 ENCOUNTER — Ambulatory Visit: Payer: 59 | Admitting: Physical Therapy

## 2015-03-12 DIAGNOSIS — M25651 Stiffness of right hip, not elsewhere classified: Secondary | ICD-10-CM

## 2015-03-12 DIAGNOSIS — M5441 Lumbago with sciatica, right side: Secondary | ICD-10-CM | POA: Diagnosis not present

## 2015-03-12 DIAGNOSIS — R293 Abnormal posture: Secondary | ICD-10-CM

## 2015-03-12 NOTE — Therapy (Signed)
[facility] [address], [place], [place], [address] Phone: [phone]   Fax:  [phone]  Physical Therapy Treatment  Patient Details  Name: [name] MRN: [id number] Date of Birth: 1960/08/10 Referring Provider:  Berle Mull, MD  Encounter Date: 03/12/2015      PT End of Session - 03/12/15 1512    Visit Number 9   Date for PT Re-Evaluation 04/06/15   PT Start Time 1450   PT Stop Time 3704   PT Time Calculation (min) 54 min   Activity Tolerance Patient tolerated treatment well   Behavior During Therapy [facility] for tasks assessed/performed      Past Medical History  Diagnosis Date  . IBS (irritable bowel syndrome)   . Hiatal hernia   . CMV (cytomegalovirus infection)     Past Surgical History  Procedure Laterality Date  . Drug induced hepatitis      There were no vitals filed for this visit.  Visit Diagnosis:  Right-sided low back pain with right-sided sciatica  Hip stiffness, right  Posture imbalance      Subjective Assessment - 03/12/15 1504    Subjective Pt was able to tolerate prolonged standing at work today, due to a taining class on new equipment. Pt is weaning away from pain meds in the evening and is able to sleep,  but wakes up twice at night.   Currently in Pain? No/denies   Pain Score --  only pain with standing in one place.    Multiple Pain Sites No                         [facility] Adult PT Treatment/Exercise - 03/12/15 0001    Exercises   Exercises Lumbar   Lumbar Exercises: Stretches   Active Hamstring Stretch 3 reps;20 seconds  in sitting   Single Knee to Chest Stretch 3 reps;20 seconds   Single Knee to Chest Stretch Limitations diagonal knee to chest 3x20 seconds   Lower Trunk Rotation 3 reps;20 seconds   Piriformis Stretch 3 reps;20 seconds  able to perform in supine today   Lumbar Exercises: Aerobic   UBE (Upper Arm Bike) L 1 x 47mn (3/30 sitting on  green physioball   Lumbar Exercises: Supine   Ab Set 20 reps   AB Set Limitations transversus ab training and pelvic floor contraction-leg motions added   Other Supine Lumbar Exercises Foam roll for elongation and decompression x 2 min  towel under head for support   Modalities   Modalities Electrical Stimulation   Moist Heat Therapy   Number Minutes Moist Heat 15 Minutes   Moist Heat Location Lumbar Spine;Other (comment)   EAcupuncturistLocation Rt gluteals and lumbar spine   Electrical Stimulation Action IFC   Electrical Stimulation Parameters 15   Electrical Stimulation Goals Pain                  PT Short Term Goals - 02/24/15 1451    PT SHORT TERM GOAL #1   Title be independent in initial HEP   Status Achieved   PT SHORT TERM GOAL #2   Title report a 30% reduction in Rt hip/LE pain with standing activity   Time 4   Period Weeks   Status Achieved  since injection   PT SHORT TERM GOAL #3   Title stand with neutral posture >75% of the time   Status Achieved  standing  neutral           PT Long Term Goals - 03/03/15 1518    PT LONG TERM GOAL #1   Title be independent in advanced HEP   Time 8   Period Weeks   Status On-going   PT LONG TERM GOAL #2   Title reduce FOTO to < or = to 42% limitation   Time 8   Period Weeks   Status On-going   PT LONG TERM GOAL #3   Title wean from cane for all distances  only for long distance   Time 8   Period Weeks   Status Partially Met   PT LONG TERM GOAL #4   Title report a 70% reduction in Rt LE pain with standing and walking   Time 8   Period Weeks   Status On-going               Plan - 03/12/15 1523    Clinical Impression Statement Pt was able today to perform figure 4 stretch in supine, in the past only able to perform in stitting. Pt will continue to benefit from skilled PT for stretching and strengthening.    Pt will benefit from skilled therapeutic intervention  in order to improve on the following deficits Decreased range of motion;Pain;Decreased endurance;Decreased activity tolerance;Decreased strength;Improper body mechanics   Rehab Potential Good   PT Frequency 2x / week   PT Duration 8 weeks   PT Treatment/Interventions ADLs/Self Care Home Management;Electrical Stimulation;Moist Heat;Cryotherapy;Traction;Ultrasound;Functional mobility training;Therapeutic activities;Therapeutic exercise;Manual techniques;Neuromuscular re-education;Passive range of motion   PT Next Visit Plan Continue with stretching         Problem List There are no active problems to display for this patient.   [name],ELKE PTA 03/12/2015, 5:19 PM  [facility] [address], [place], [place], [address] Phone: [phone]   Fax:  [phone]

## 2015-03-17 ENCOUNTER — Ambulatory Visit: Payer: 59 | Attending: Sports Medicine | Admitting: Physical Therapy

## 2015-03-17 ENCOUNTER — Encounter: Payer: Self-pay | Admitting: Physical Therapy

## 2015-03-17 DIAGNOSIS — R293 Abnormal posture: Secondary | ICD-10-CM | POA: Diagnosis present

## 2015-03-17 DIAGNOSIS — M5441 Lumbago with sciatica, right side: Secondary | ICD-10-CM | POA: Insufficient documentation

## 2015-03-17 DIAGNOSIS — M25651 Stiffness of right hip, not elsewhere classified: Secondary | ICD-10-CM | POA: Diagnosis present

## 2015-03-17 NOTE — Therapy (Signed)
[facility] [address], [place], [place], [address] Phone: [phone]   Fax:  [phone]  Physical Therapy Treatment  Patient Details  Name: [name] MRN: [id number] Date of Birth: [birth year] Referring Provider:  Berle Mull, MD  Encounter Date: 03/17/2015      PT End of Session - 03/17/15 1530    Visit Number 10   Date for PT Re-Evaluation 04/06/15   PT Start Time 2992   PT Stop Time 1543   PT Time Calculation (min) 58 min   Activity Tolerance Patient tolerated treatment well   Behavior During Therapy [facility] for tasks assessed/performed      Past Medical History  Diagnosis Date  . IBS (irritable bowel syndrome)   . Hiatal hernia   . CMV (cytomegalovirus infection) ([place])     Past Surgical History  Procedure Laterality Date  . Drug induced hepatitis      There were no vitals filed for this visit.  Visit Diagnosis:  Right-sided low back pain with right-sided sciatica  Hip stiffness, right  Posture imbalance                       [facility] Adult PT Treatment/Exercise - 03/17/15 0001    Exercises   Exercises Lumbar   Lumbar Exercises: Aerobic   UBE (Upper Arm Bike) L 1 x 51min (3/3) sitting on green physioball   Lumbar Exercises: Standing   Other Standing Lumbar Exercises walking backward with 25# x 10   Other Standing Lumbar Exercises squatting x 10 with red physioball   with isometric contraction upper body    Lumbar Exercises: Supine   Ab Set 20 reps   AB Set Limitations transversus ab training and pelvic floor contraction-leg motions added  ER & sliding x 3 with 2 reps, marching.   Other Supine Lumbar Exercises Foam roll for elongation and decompression x 2 min  towel under head for support / alternating UE x 10   Lumbar Exercises: Prone   Other Prone Lumbar Exercises Prayer position x 3 with 20 sec hold, pillow b/w knees   pt with improved flexibility   Modalities   Modalities Electrical Stimulation   Moist Heat Therapy   Number Minutes Moist Heat 15 Minutes   Moist Heat Location Lumbar Spine;Other (comment)   Acupuncturist Location Rt gluteals and lumbar spine   Electrical Stimulation Action IFC   Electrical Stimulation Parameters 15   Electrical Stimulation Goals Pain                  PT Short Term Goals - 02/24/15 1451    PT SHORT TERM GOAL #1   Title be independent in initial HEP   Status Achieved   PT SHORT TERM GOAL #2   Title report a 30% reduction in Rt hip/LE pain with standing activity   Time 4   Period Weeks   Status Achieved  since injection   PT SHORT TERM GOAL #3   Title stand with neutral posture >75% of the time   Status Achieved  standing neutral           PT Long Term Goals - 03/17/15 1532    PT LONG TERM GOAL #1   Title be independent in advanced HEP   Time 8   Period Weeks   Status On-going   PT LONG TERM GOAL #2   Title reduce FOTO to < or = to 42%  limitation   Time 8   Period Weeks   Status Revised   PT LONG TERM GOAL #3   Title wean from cane for all distances   Time 8   Period Weeks   Status Achieved   PT LONG TERM GOAL #4   Title report a 70% reduction in Rt LE pain with standing and walking   Time 8   Period Weeks   Status On-going               Plan - 03/17/15 1530    Clinical Impression Statement Pt was able to perform strengthening exercises as ressisted walking without increase in pain. Pt will continue to benefit from skilled PT for stretching and strengthening.    Pt will benefit from skilled therapeutic intervention in order to improve on the following deficits Decreased range of motion;Pain;Decreased endurance;Decreased activity tolerance;Decreased strength;Improper body mechanics   Rehab Potential Good   PT Frequency 2x / week   PT Duration 8 weeks   PT Treatment/Interventions ADLs/Self Care Home Management;Electrical  Stimulation;Moist Heat;Cryotherapy;Traction;Ultrasound;Functional mobility training;Therapeutic activities;Therapeutic exercise;Manual techniques;Neuromuscular re-education;Passive range of motion   PT Next Visit Plan Continue stretching lumbar, TA activation and back strenght   Consulted and Agree with Plan of Care Patient        Problem List There are no active problems to display for this patient.   [name],Parneet Glantz PTA 03/17/2015, 3:35 PM  [facility] [address] [place], [place], [address] Phone: [phone]   Fax:  [phone]

## 2015-03-19 ENCOUNTER — Encounter: Payer: Self-pay | Admitting: Physical Therapy

## 2015-03-19 ENCOUNTER — Ambulatory Visit: Payer: 59 | Admitting: Physical Therapy

## 2015-03-19 DIAGNOSIS — M5441 Lumbago with sciatica, right side: Secondary | ICD-10-CM | POA: Diagnosis not present

## 2015-03-19 DIAGNOSIS — R293 Abnormal posture: Secondary | ICD-10-CM

## 2015-03-19 DIAGNOSIS — M25651 Stiffness of right hip, not elsewhere classified: Secondary | ICD-10-CM

## 2015-03-19 NOTE — Therapy (Signed)
[facility] [address], [place], [place], [address] Phone: [phone]   Fax:  [phone]  Physical Therapy Treatment  Patient Details  Name: [name] MRN: [id number] Date of Birth: [birth year] Referring Provider:  Mayra Neer, MD  Encounter Date: 03/19/2015      PT End of Session - 03/19/15 1515    Visit Number 11   Date for PT Re-Evaluation 04/06/15   PT Start Time 1446   PT Stop Time 1546   PT Time Calculation (min) 60 min   Activity Tolerance Patient tolerated treatment well   Behavior During Therapy [facility] for tasks assessed/performed      Past Medical History  Diagnosis Date  . IBS (irritable bowel syndrome)   . Hiatal hernia   . CMV (cytomegalovirus infection) ([place])     Past Surgical History  Procedure Laterality Date  . Drug induced hepatitis      There were no vitals filed for this visit.  Visit Diagnosis:  Right-sided low back pain with right-sided sciatica  Hip stiffness, right  Posture imbalance      Subjective Assessment - 03/19/15 1509    Subjective Pt reports had incr discomfort after last PT may be due to incr of weights with activities. Pain in low back in average rated as 2/10   Pertinent History 2 neck surgeries   Currently in Pain? Yes   Pain Score 2    Pain Location Back   Pain Orientation Right   Pain Descriptors / Indicators Aching;Tightness   Pain Type Chronic pain   Pain Onset More than a month ago   Multiple Pain Sites No                         [facility] Adult PT Treatment/Exercise - 03/19/15 0001    Exercises   Exercises Lumbar   Lumbar Exercises: Stretches   Single Knee to Chest Stretch 3 reps;20 seconds   Single Knee to Chest Stretch Limitations diagonal knee to chest 3x20 seconds   Double Knee to Chest Stretch 3 reps;20 seconds   Lower Trunk Rotation 3 reps;20 seconds   Lumbar Exercises: Supine   Ab Set 20 reps   AB Set Limitations  transversus ab training and pelvic floor contraction-leg motions added  ER, sliding each x 3 with 3 reps, marching    Other Supine Lumbar Exercises Foam roll for elongation and decompression x 2 min  towel under head for support   Knee/Hip Exercises: Aerobic   Nustep Level 2x 8 minutes  seat and arms #8   Modalities   Modalities Electrical Stimulation   Moist Heat Therapy   Number Minutes Moist Heat 15 Minutes   Moist Heat Location Lumbar Spine;Other (comment)   Acupuncturist Location Rt gluteals and lumbar spine   Electrical Stimulation Action IFC   Electrical Stimulation Parameters 15   Electrical Stimulation Goals Pain                  PT Short Term Goals - 02/24/15 1451    PT SHORT TERM GOAL #1   Title be independent in initial HEP   Status Achieved   PT SHORT TERM GOAL #2   Title report a 30% reduction in Rt hip/LE pain with standing activity   Time 4   Period Weeks   Status Achieved  since injection   PT SHORT TERM GOAL #3   Title stand with neutral  posture >75% of the time   Status Achieved  standing neutral           PT Long Term Goals - 03/17/15 1532    PT LONG TERM GOAL #1   Title be independent in advanced HEP   Time 8   Period Weeks   Status On-going   PT LONG TERM GOAL #2   Title reduce FOTO to < or = to 42% limitation   Time 8   Period Weeks   Status Revised   PT LONG TERM GOAL #3   Title wean from cane for all distances   Time 8   Period Weeks   Status Achieved   PT LONG TERM GOAL #4   Title report a 70% reduction in Rt LE pain with standing and walking   Time 8   Period Weeks   Status On-going               Plan - 03/19/15 1516    Clinical Impression Statement Pt with good performance of TA activation. Pt will continue to benefit from skilled PT for stretching and strengthening.   Pt will benefit from skilled therapeutic intervention in order to improve on the following deficits  Decreased range of motion;Pain;Decreased endurance;Decreased activity tolerance;Decreased strength;Improper body mechanics   Rehab Potential Good   PT Frequency 2x / week   PT Duration 8 weeks   PT Treatment/Interventions ADLs/Self Care Home Management;Electrical Stimulation;Moist Heat;Cryotherapy;Traction;Ultrasound;Functional mobility training;Therapeutic activities;Therapeutic exercise;Manual techniques;Neuromuscular re-education;Passive range of motion   PT Next Visit Plan Discuss with patient continue of PT vs D/C   Consulted and Agree with Plan of Care Patient        Problem List There are no active problems to display for this patient.   [name],[name] PTA 03/19/2015, 3:38 PM  [facility] [address], [place], [place], [address] Phone: [phone]   Fax:  [phone]

## 2015-03-30 ENCOUNTER — Ambulatory Visit: Payer: 59

## 2015-03-31 ENCOUNTER — Encounter: Payer: Self-pay | Admitting: Physical Therapy

## 2015-03-31 ENCOUNTER — Ambulatory Visit: Payer: 59 | Admitting: Physical Therapy

## 2015-03-31 DIAGNOSIS — M5441 Lumbago with sciatica, right side: Secondary | ICD-10-CM | POA: Diagnosis not present

## 2015-03-31 DIAGNOSIS — M25651 Stiffness of right hip, not elsewhere classified: Secondary | ICD-10-CM

## 2015-03-31 DIAGNOSIS — R293 Abnormal posture: Secondary | ICD-10-CM

## 2015-03-31 NOTE — Therapy (Signed)
[facility] [address], [place], [place], [address] Phone: [phone]   Fax:  [phone]  Physical Therapy Treatment  Patient Details  Name: MAHAYLA HADDAWAY MRN: [id number] Date of Birth: 1960/07/13 No Data Recorded  Encounter Date: 03/31/2015      PT End of Session - 03/31/15 1713    Visit Number 12   Date for PT Re-Evaluation 04/06/15   PT Start Time 1447   PT Stop Time 1546   PT Time Calculation (min) 59 min   Activity Tolerance Patient tolerated treatment well   Behavior During Therapy [facility] for tasks assessed/performed      Past Medical History  Diagnosis Date  . IBS (irritable bowel syndrome)   . Hiatal hernia   . CMV (cytomegalovirus infection) ([place])     Past Surgical History  Procedure Laterality Date  . Drug induced hepatitis      There were no vitals filed for this visit.  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 Pt is unable to sit on floor to finish project for granddaughter. Pt weaned away from cane since 4 weeks, but  is challenged with ambulation on uneven surface.   How long can you stand comfortably? 68min   Currently in Pain? Yes   Pain Score 3    Pain Location Back   Pain Orientation Right   Pain Descriptors / Indicators Aching;Tightness   Pain Type Chronic pain   Pain Onset More than a month ago   Pain Frequency Constant   Aggravating Factors  prolonged standing and sitting, walking to long, sudden bending   Pain Relieving Factors pain medication, injection, ice    Multiple Pain Sites No            [facility] PT Assessment - 03/31/15 0001    Observation/Other Assessments   Focus on Therapeutic Outcomes (FOTO)  64% limitations   Despite Foto, pt scores her improvment as 75%, since start o                     [facility] Adult PT Treatment/Exercise - 03/31/15 0001    Exercises   Exercises Lumbar   Lumbar Exercises: Stretches   Single Knee to Chest Stretch 3 reps;20 seconds   Single Knee to Chest Stretch Limitations diagonal knee to chest 3x20 seconds   Double Knee to Chest Stretch 3 reps;20 seconds   Lumbar Exercises: Supine   AB Set Limitations transversus ab training and pelvic floor contraction-leg motions added  ER, sliding x 3 with 3 reps, marching   Lumbar Exercises: Prone   Other Prone Lumbar Exercises Prayer position x 3 with 20 sec hold, pillow b/w knees    Knee/Hip Exercises: Aerobic   Nustep Level 2x 10 min, seat &arms #8   Modalities   Modalities Electrical Stimulation   Moist Heat Therapy   Number Minutes Moist Heat 15 Minutes   Moist Heat Location Lumbar Spine;Other (comment)   Acupuncturist Location Rt gluteals and lumbar spine   Electrical Stimulation Action IFC   Electrical Stimulation Parameters 15   Electrical Stimulation Goals Pain  PT Short Term Goals - 02/24/15 1451    PT SHORT TERM GOAL #1   Title be independent in initial HEP   Status Achieved   PT SHORT TERM GOAL #2   Title report a 30% reduction in Rt hip/LE pain with standing activity   Time 4   Period Weeks   Status Achieved  since injection   PT SHORT TERM GOAL #3   Title stand with neutral posture >75% of the time   Status Achieved  standing neutral           PT Long Term Goals - 03/31/15 1717    PT LONG TERM GOAL #1   Title be independent in advanced HEP   Time 8   Period Weeks   Status On-going   PT LONG TERM GOAL #2   Title reduce FOTO to < or = to 42% limitation   Time 8   Period Weeks   Status On-going   PT LONG TERM GOAL #3   Title wean from cane for all distances   Time 8   Period Weeks   Status Achieved   PT LONG TERM  GOAL #4   Title report a 70% reduction in Rt LE pain with standing and walking  Pt rates her improvement as 75% in Rt LE   Time 8   Period Weeks   Status Achieved               Plan - 03/31/15 1714    Clinical Impression Statement Pt weaned away from cane and allover rates her improvement as 75%. She is now able to tolerate activities for example prayer stretch and able to tolerate gentle strengthening. Patient will continue to benefit from skilled PT to improve strength, flexibility in lumbar spine and improve endurance.   Pt will benefit from skilled therapeutic intervention in order to improve on the following deficits Decreased range of motion;Pain;Decreased endurance;Decreased activity tolerance;Decreased strength;Improper body mechanics   Rehab Potential Good   PT Frequency 2x / week   PT Duration 8 weeks   PT Treatment/Interventions ADLs/Self Care Home Management;Electrical Stimulation;Moist Heat;Cryotherapy;Traction;Ultrasound;Functional mobility training;Therapeutic activities;Therapeutic exercise;Manual techniques;Neuromuscular re-education;Passive range of motion   PT Next Visit Plan Renewal next visit to continue with PT , to improve lumbar flexibility, strength and endurance   Consulted and Agree with Plan of Care Patient        Problem List There are no active problems to display for this patient.   [name],Abriel Geesey PTA 03/31/2015, 5:19 PM  [facility] [address], [place], [place], [address] Phone: [phone]   Fax:  [phone]  Name: TYMIRA HORKEY MRN: [id number] Date of Birth: 28-Jul-1960

## 2015-04-02 ENCOUNTER — Ambulatory Visit: Payer: 59

## 2015-04-02 DIAGNOSIS — M5441 Lumbago with sciatica, right side: Secondary | ICD-10-CM | POA: Diagnosis not present

## 2015-04-02 DIAGNOSIS — M25651 Stiffness of right hip, not elsewhere classified: Secondary | ICD-10-CM

## 2015-04-02 NOTE — Therapy (Signed)
[facility] [address] [place], [place], [address] Phone: [phone]   Fax:  [phone]  Physical Therapy Treatment  Patient Details  Name: [name] MRN: [id number] Date of Birth: 1960-07-16 Referring Provider: Berle Mull, MD  Encounter Date: 04/02/2015      PT End of Session - 04/02/15 1519    Visit Number 13   Date for PT Re-Evaluation 05/15/15   PT Start Time 1450   PT Stop Time 1544   PT Time Calculation (min) 54 min   Activity Tolerance Patient tolerated treatment well   Behavior During Therapy [facility] for tasks assessed/performed      Past Medical History  Diagnosis Date  . IBS (irritable bowel syndrome)   . Hiatal hernia   . CMV (cytomegalovirus infection) ([place])     Past Surgical History  Procedure Laterality Date  . Drug induced hepatitis      There were no vitals filed for this visit.  Visit Diagnosis:  Right-sided low back pain with right-sided sciatica - Plan: PT plan of care cert/re-cert  Hip stiffness, right - Plan: PT plan of care cert/re-cert      Subjective Assessment - 04/02/15 1450    Subjective Spasms and pain the Lt leg today.  Pt does not want to do NuStep.     Currently in Pain? Yes   Pain Score 6   3/10 dull when not moving   Pain Location Back   Pain Orientation Right;Left   Pain Descriptors / Indicators Spasm;Dull   Pain Type Chronic pain   Pain Radiating Towards Lt hip today   Aggravating Factors  sitting in the car for too long, prolonged standing and sitting   Pain Relieving Factors walking, pain medication, ice            [facility] PT Assessment - 04/02/15 0001    Assessment   Medical Diagnosis Lumbosacral strain   Referring Provider Berle Mull, MD   Prior Function   Level of Independence Independent   Vocation Full time employment   Vocation Requirements x-ray supervisor, 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   Leisure walk for exercise, flower arranging   Cognition   Overall Cognitive Status Within Functional Limits for tasks assessed   Observation/Other Assessments   Focus on Therapeutic Outcomes (FOTO)  64% limitations                     [facility] Adult PT Treatment/Exercise - 04/02/15 0001    Modalities   Modalities Traction   Moist Heat Therapy   Number Minutes Moist Heat 15 Minutes   Moist Heat Location Lumbar Spine;Other (comment)   Acupuncturist Location Rt gluteals and lumbar spine   Electrical Stimulation Action IFC   Electrical Stimulation Parameters 15   Electrical Stimulation Goals Pain   Traction   Type of Traction Lumbar   Min (lbs) 50   Max (lbs) 85   Hold Time 60   Rest Time 20   Time 15                  PT Short Term Goals - 02/24/15 1451    PT SHORT TERM GOAL #1   Title be independent in initial HEP   Status Achieved   PT SHORT TERM GOAL #2   Title report a 30% reduction in Rt hip/LE pain with standing activity   Time 4   Period Weeks   Status Achieved  since injection  PT SHORT TERM GOAL #3   Title stand with neutral posture >75% of the time   Status Achieved  standing neutral           PT Long Term Goals - 04/02/15 1456    PT LONG TERM GOAL #1   Title be independent in advanced HEP   Time 6   Period Weeks   Status On-going   PT LONG TERM GOAL #2   Title reduce FOTO to < or = to 42% limitation   Time 6   Period Weeks   Status On-going   PT LONG TERM GOAL #3   Title wean from cane for all distances   Status Achieved   PT LONG TERM GOAL #4   Title report a 70% reduction in Rt LE pain with standing and walking   Status Achieved  95% better   PT LONG TERM GOAL #5   Title report a 70% reduction in Lt LE pain with standing and walking   Time 6   Period Weeks   Status New   Additional Long Term Goals   Additional Long Term Goals Yes   PT LONG TERM GOAL #6   Title stand and lift at work with 50% less pain   Time 6   Period Weeks    Status New               Plan - 04/02/15 1507    Clinical Impression Statement Pt reports 95% overall improvement in Rt LE symptoms.  3 days ago, pt reports that she started having spasms in Lt side of the back and Lt LE.  Pt with FOTO score of 64% limitation. Pt was making steady progress before this flare-up of pain. Pt feels limited with standing long periods and with lifting at work.   Pt will plan to see MD soon to discuss pain.  PT initiated traction today.  Pt will benefit from skilled PT to provide pain management, traction and advancement of exercise.     Pt will benefit from skilled therapeutic intervention in order to improve on the following deficits Decreased range of motion;Pain;Decreased endurance;Decreased activity tolerance;Decreased strength;Improper body mechanics   Rehab Potential Good   PT Frequency 2x / week   PT Duration 6 weeks   PT Treatment/Interventions ADLs/Self Care Home Management;Electrical Stimulation;Moist Heat;Cryotherapy;Traction;Ultrasound;Functional mobility training;Therapeutic activities;Therapeutic exercise;Manual techniques;Neuromuscular re-education;Passive range of motion   PT Next Visit Plan 2x/wk for 6 weeks.  Continue traction if helpful.     Consulted and Agree with Plan of Care Patient        Problem List There are no active problems to display for this patient.   Lizbett Garciagarcia, PT 04/02/2015, 3:36 PM  [facility] [address] [place], [place], [address] Phone: [phone]   Fax:  [phone]  Name: [name] MRN: [id number] Date of Birth: Jul 09, 1960

## 2015-04-08 ENCOUNTER — Ambulatory Visit: Payer: 59

## 2015-04-14 ENCOUNTER — Ambulatory Visit: Payer: 59 | Attending: Sports Medicine | Admitting: Physical Therapy

## 2015-04-14 ENCOUNTER — Encounter: Payer: Self-pay | Admitting: Physical Therapy

## 2015-04-14 DIAGNOSIS — M25651 Stiffness of right hip, not elsewhere classified: Secondary | ICD-10-CM | POA: Insufficient documentation

## 2015-04-14 DIAGNOSIS — R293 Abnormal posture: Secondary | ICD-10-CM | POA: Diagnosis present

## 2015-04-14 DIAGNOSIS — M5441 Lumbago with sciatica, right side: Secondary | ICD-10-CM | POA: Diagnosis not present

## 2015-04-14 NOTE — Therapy (Signed)
[facility] [address] [place], [place], [address] Phone: [phone]   Fax:  [phone]  Physical Therapy Treatment  Patient Details  Name: MATTYE VERDONE MRN: [id number] Date of Birth: Jan 25, 1961 Referring Provider: Berle Mull, MD  Encounter Date: 04/14/2015      PT End of Session - 04/14/15 1524    Visit Number 14   Date for PT Re-Evaluation 05/15/15   PT Start Time 7672   PT Stop Time 1534   PT Time Calculation (min) 47 min   Activity Tolerance Patient tolerated treatment well   Behavior During Therapy [facility] for tasks assessed/performed      Past Medical History  Diagnosis Date  . IBS (irritable bowel syndrome)   . Hiatal hernia   . CMV (cytomegalovirus infection) ([place])     Past Surgical History  Procedure Laterality Date  . Drug induced hepatitis      There were no vitals filed for this visit.  Visit Diagnosis:  Right-sided low back pain with right-sided sciatica  Hip stiffness, right  Posture imbalance      Subjective Assessment - 04/14/15 1500    Subjective Pt is feeling better today, able to perform Nu-Step   Currently in Pain? Yes   Pain Location Back   Pain Orientation Right;Left   Pain Descriptors / Indicators Tightness   Pain Type Chronic pain   Pain Onset More than a month ago   Pain Frequency Constant   Multiple Pain Sites No                         [facility] Adult PT Treatment/Exercise - 04/14/15 0001    Bed Mobility   Bed Mobility Supine to Sit  practiced log rolling 2 x 6 to left side   Exercises   Exercises Lumbar   Lumbar Exercises: Supine   AB Set Limitations transversus ab training and pelvic floor contraction-leg motions added  ER, marching, leg ext x3 each with 3 reps each leg   Knee/Hip Exercises: Aerobic   Nustep Level 2x 10 min, seat &arms #8   Modalities   Modalities Traction   Traction   Type of Traction Lumbar   Min (lbs) 50   Max  (lbs) 85   Hold Time 60   Rest Time 20   Time 15                  PT Short Term Goals - 02/24/15 1451    PT SHORT TERM GOAL #1   Title be independent in initial HEP   Status Achieved   PT SHORT TERM GOAL #2   Title report a 30% reduction in Rt hip/LE pain with standing activity   Time 4   Period Weeks   Status Achieved  since injection   PT SHORT TERM GOAL #3   Title stand with neutral posture >75% of the time   Status Achieved  standing neutral           PT Long Term Goals - 04/14/15 1528    PT LONG TERM GOAL #1   Title be independent in advanced HEP   Time 6   Period Weeks   Status On-going   PT LONG TERM GOAL #2   Title reduce FOTO to < or = to 42% limitation   Time 6   Period Weeks   Status On-going   PT LONG TERM GOAL #3   Title wean from cane  for all distances   Time 8   Period Weeks   Status Achieved   PT LONG TERM GOAL #4   Title report a 70% reduction in Rt LE pain with standing and walking   Time 8   Period Weeks   Status Achieved   PT LONG TERM GOAL #5   Title report a 70% reduction in Lt LE pain with standing and walking   Time 6   Period Weeks   Status On-going   PT LONG TERM GOAL #6   Title stand and lift at work with 50% less pain   Time 6   Period Weeks   Status On-going               Plan - 04/14/15 1525    Clinical Impression Statement Pt reports 50% improvement since last weeks flare up, no complains of radiating pain in Rt LE. Pt complains of stiffness in back. Pt was able to tolerate Nu-step and TA activation. Traction is helping. Pt will continue to benefit from skilled Pt, traction and modalities   Pt will benefit from skilled therapeutic intervention in order to improve on the following deficits Decreased range of motion;Pain;Decreased endurance;Decreased activity tolerance;Decreased strength;Improper body mechanics   Rehab Potential Good   PT Frequency 2x / week   PT Duration 6 weeks   PT Next Visit Plan  Continue with traction   Consulted and Agree with Plan of Care Patient        Problem List There are no active problems to display for this patient.   [name],Camille Thau PTA 04/14/2015, 5:02 PM  [facility] [address], [place], [place], [address] Phone: [phone]   Fax:  [phone]  Name: ASHTAN LATON MRN: [id number] Date of Birth: April 14, 1961

## 2015-04-16 ENCOUNTER — Encounter: Payer: Self-pay | Admitting: Physical Therapy

## 2015-04-16 ENCOUNTER — Ambulatory Visit: Payer: 59 | Admitting: Physical Therapy

## 2015-04-16 DIAGNOSIS — M25651 Stiffness of right hip, not elsewhere classified: Secondary | ICD-10-CM

## 2015-04-16 DIAGNOSIS — M5441 Lumbago with sciatica, right side: Secondary | ICD-10-CM | POA: Diagnosis not present

## 2015-04-16 DIAGNOSIS — R293 Abnormal posture: Secondary | ICD-10-CM

## 2015-04-16 NOTE — Therapy (Signed)
[facility] [address] [place], [place], [address] Phone: [phone]   Fax:  [phone]  Physical Therapy Treatment  Patient Details  Name: [name] MRN: [id number] Date of Birth: April 28, 1961 Referring Provider: Berle Mull, MD  Encounter Date: 04/16/2015      PT End of Session - 04/16/15 1409    Visit Number 15   Date for PT Re-Evaluation 05/15/15   PT Start Time 1400   PT Stop Time 1458   PT Time Calculation (min) 58 min   Activity Tolerance Patient tolerated treatment well   Behavior During Therapy [facility] for tasks assessed/performed      Past Medical History  Diagnosis Date  . IBS (irritable bowel syndrome)   . Hiatal hernia   . CMV (cytomegalovirus infection) ([place])     Past Surgical History  Procedure Laterality Date  . Drug induced hepatitis      There were no vitals filed for this visit.  Visit Diagnosis:  Right-sided low back pain with right-sided sciatica  Hip stiffness, right  Posture imbalance      Subjective Assessment - 04/16/15 1404    Subjective Pt continues to feel better in low back.    Currently in Pain? Yes   Pain Score 2    Pain Location Back   Pain Orientation Right;Left   Pain Descriptors / Indicators Tightness   Pain Type Chronic pain   Pain Onset More than a month ago   Pain Frequency Constant   Multiple Pain Sites No                         [facility] Adult PT Treatment/Exercise - 04/16/15 0001    Exercises   Exercises Lumbar   Lumbar Exercises: Stretches   Single Knee to Chest Stretch 3 reps;20 seconds   Double Knee to Chest Stretch 3 reps;20 seconds  using towel   Lower Trunk Rotation 3 reps;20 seconds  each side   Lumbar Exercises: Supine   AB Set Limitations --   Knee/Hip Exercises: Aerobic   Other Aerobic UBE sitting on green physioball x6 (3/3)   Modalities   Modalities Traction   Traction   Type of Traction Lumbar   Min (lbs)  50   Max (lbs) 85   Hold Time 60   Rest Time 20   Time 15   Manual Therapy   Manual Therapy Soft tissue mobilization   Soft tissue mobilization in left & Rt sidelying ot Rt QL and lumbar paraspinals                  PT Short Term Goals - 02/24/15 1451    PT SHORT TERM GOAL #1   Title be independent in initial HEP   Status Achieved   PT SHORT TERM GOAL #2   Title report a 30% reduction in Rt hip/LE pain with standing activity   Time 4   Period Weeks   Status Achieved  since injection   PT SHORT TERM GOAL #3   Title stand with neutral posture >75% of the time   Status Achieved  standing neutral           PT Long Term Goals - 04/14/15 1528    PT LONG TERM GOAL #1   Title be independent in advanced HEP   Time 6   Period Weeks   Status On-going   PT LONG TERM GOAL #2   Title reduce FOTO  to < or = to 42% limitation   Time 6   Period Weeks   Status On-going   PT LONG TERM GOAL #3   Title wean from cane for all distances   Time 8   Period Weeks   Status Achieved   PT LONG TERM GOAL #4   Title report a 70% reduction in Rt LE pain with standing and walking   Time 8   Period Weeks   Status Achieved   PT LONG TERM GOAL #5   Title report a 70% reduction in Lt LE pain with standing and walking   Time 6   Period Weeks   Status On-going   PT LONG TERM GOAL #6   Title stand and lift at work with 50% less pain   Time 6   Period Weeks   Status On-going               Plan - 04/16/15 1411    Clinical Impression Statement Pt reports continue to improve since last weeks flare up, no complain of radiating pain in Rt LE. Stiffness in low back is present. Pt was able to tolerate UBE sitting on physioball. Pt will continue to benefit from skilled PT, traction and modalities.    Pt will benefit from skilled therapeutic intervention in order to improve on the following deficits Decreased range of motion;Pain;Decreased endurance;Decreased activity  tolerance;Decreased strength;Improper body mechanics   Rehab Potential Good   PT Frequency 2x / week   PT Duration 8 weeks   PT Treatment/Interventions ADLs/Self Care Home Management;Electrical Stimulation;Moist Heat;Cryotherapy;Traction;Ultrasound;Functional mobility training;Therapeutic activities;Therapeutic exercise;Manual techniques;Neuromuscular re-education;Passive range of motion   PT Next Visit Plan continue with traction and with gentle stretching and strengthening exercises   Consulted and Agree with Plan of Care Patient        Problem List There are no active problems to display for this patient.   [name],Taggert Bozzi PTA 04/16/2015, 5:30 PM  [facility] [address], [place], [place], [address] Phone: [phone]   Fax:  [phone]  Name: [name] MRN: [id number] Date of Birth: November 26, 1960

## 2015-04-21 ENCOUNTER — Encounter: Payer: Self-pay | Admitting: Physical Therapy

## 2015-04-21 ENCOUNTER — Ambulatory Visit: Payer: 59 | Admitting: Physical Therapy

## 2015-04-21 DIAGNOSIS — R293 Abnormal posture: Secondary | ICD-10-CM

## 2015-04-21 DIAGNOSIS — M5441 Lumbago with sciatica, right side: Secondary | ICD-10-CM | POA: Diagnosis not present

## 2015-04-21 DIAGNOSIS — M25651 Stiffness of right hip, not elsewhere classified: Secondary | ICD-10-CM

## 2015-04-21 NOTE — Therapy (Signed)
[facility] [address], [place], [place], [address] Phone: [phone]   Fax:  [phone]  Physical Therapy Treatment  Patient Details  Name: [name] MRN: [id number] Date of Birth: [birth year] Referring Provider: Berle Mull, MD  Encounter Date: 04/21/2015      PT End of Session - 04/21/15 1457    Visit Number 16   Date for PT Re-Evaluation 05/15/15   PT Start Time 1446   PT Stop Time 1538   PT Time Calculation (min) 52 min   Activity Tolerance Patient tolerated treatment well   Behavior During Therapy [facility] for tasks assessed/performed      Past Medical History  Diagnosis Date  . IBS (irritable bowel syndrome)   . Hiatal hernia   . CMV (cytomegalovirus infection) ([place])     Past Surgical History  Procedure Laterality Date  . Drug induced hepatitis      There were no vitals filed for this visit.  Visit Diagnosis:  Right-sided low back pain with right-sided sciatica  Hip stiffness, right  Posture imbalance      Subjective Assessment - 04/21/15 1452    Pertinent History No complain of pain in the back but tightness in low back, pain in left knee and tingling in left foot today.   Currently in Pain? Yes   Pain Score 2    Pain Location Back   Pain Orientation Right;Left   Pain Descriptors / Indicators Tightness   Pain Type Chronic pain   Pain Onset More than a month ago   Pain Frequency Constant   Multiple Pain Sites No                         [facility] Adult PT Treatment/Exercise - 04/21/15 0001    Exercises   Exercises Lumbar   Lumbar Exercises: Stretches   Active Hamstring Stretch 3 reps;20 seconds  in sitting and supine with green strap -while on HMP   Single Knee to Chest Stretch 3 reps;20 seconds  on HMP   Double Knee to Chest Stretch 3 reps;20 seconds  on HMP   Lower Trunk Rotation 3 reps;20 seconds  on HMP   Lumbar Exercises: Prone   Other Prone Lumbar  Exercises Prayer position x 3 with 20 sec hold, pillow b/w knees    Knee/Hip Exercises: Aerobic   Other Aerobic UBE sitting on green physioball x6 (3/3)   Modalities   Modalities Traction   Traction   Type of Traction Lumbar   Min (lbs) 55   Max (lbs) 90   Hold Time 60   Rest Time 20   Time 15   Manual Therapy   Manual Therapy Soft tissue mobilization   Soft tissue mobilization in left & Rt sidelying ot Rt QL and lumbar paraspinals                  PT Short Term Goals - 02/24/15 1451    PT SHORT TERM GOAL #1   Title be independent in initial HEP   Status Achieved   PT SHORT TERM GOAL #2   Title report a 30% reduction in Rt hip/LE pain with standing activity   Time 4   Period Weeks   Status Achieved  since injection   PT SHORT TERM GOAL #3   Title stand with neutral posture >75% of the time   Status Achieved  standing neutral  PT Long Term Goals - 04/21/15 1506    PT LONG TERM GOAL #1   Title be independent in advanced HEP   Time 6   Period Weeks   Status On-going   PT LONG TERM GOAL #2   Title reduce FOTO to < or = to 42% limitation   Time 6   Period Weeks   Status On-going   PT LONG TERM GOAL #3   Title wean from cane for all distances   Time 8   Period Weeks   Status Achieved   PT LONG TERM GOAL #4   Title report a 70% reduction in Rt LE pain with standing and walking   Time 8   Period Weeks   Status Achieved   PT LONG TERM GOAL #5   Title report a 70% reduction in Lt LE pain with standing and walking   Time 8   Period Weeks   Status On-going   PT LONG TERM GOAL #6   Title stand and lift at work with 50% less pain  75% improvement   Time 6   Period Weeks   Status Achieved               Plan - 04/21/15 1503    Clinical Impression Statement Pt presents with tingling in lleft foot, but imporving with stretching exercises. No pain in low back but stiffness. Pt will continue to benefit from skilled PT, traction and  modalities   Pt will benefit from skilled therapeutic intervention in order to improve on the following deficits Decreased range of motion;Pain;Decreased endurance;Decreased activity tolerance;Decreased strength;Improper body mechanics   Rehab Potential Good   PT Frequency 2x / week   PT Duration 8 weeks   PT Treatment/Interventions ADLs/Self Care Home Management;Electrical Stimulation;Moist Heat;Cryotherapy;Traction;Ultrasound;Functional mobility training;Therapeutic activities;Therapeutic exercise;Manual techniques;Neuromuscular re-education;Passive range of motion   PT Next Visit Plan continue with traction and with gentle stretching and strengthening exercises   Consulted and Agree with Plan of Care Patient        Problem List There are no active problems to display for this patient.   [name],Roxi Hlavaty 04/21/2015, 3:30 PM  [facility] [address], [place], [place], [address] Phone: [phone]   Fax:  [phone]  Name: DELEAH TISON MRN: [id number] Date of Birth: 1960-08-03

## 2015-04-23 ENCOUNTER — Ambulatory Visit: Payer: 59 | Admitting: Physical Therapy

## 2015-04-23 ENCOUNTER — Encounter: Payer: Self-pay | Admitting: Physical Therapy

## 2015-04-23 DIAGNOSIS — M5441 Lumbago with sciatica, right side: Secondary | ICD-10-CM | POA: Diagnosis not present

## 2015-04-23 DIAGNOSIS — M25651 Stiffness of right hip, not elsewhere classified: Secondary | ICD-10-CM

## 2015-04-23 DIAGNOSIS — R293 Abnormal posture: Secondary | ICD-10-CM

## 2015-04-23 NOTE — Therapy (Signed)
[facility] [address] [place], [place], [address] Phone: [phone]   Fax:  [phone]  Physical Therapy Treatment  Patient Details  Name: [name] MRN: [id number] Date of Birth: 06-19-1960 Referring Provider: Berle Mull, MD  Encounter Date: 04/23/2015      PT End of Session - 04/23/15 1419    Visit Number 17   Date for PT Re-Evaluation 05/15/15   PT Start Time 1400   PT Stop Time 1450   PT Time Calculation (min) 50 min   Activity Tolerance Patient tolerated treatment well   Behavior During Therapy [facility] for tasks assessed/performed      Past Medical History  Diagnosis Date  . IBS (irritable bowel syndrome)   . Hiatal hernia   . CMV (cytomegalovirus infection) ([place])     Past Surgical History  Procedure Laterality Date  . Drug induced hepatitis      There were no vitals filed for this visit.  Visit Diagnosis:  Right-sided low back pain with right-sided sciatica  Hip stiffness, right  Posture imbalance      Subjective Assessment - 04/23/15 1409    Subjective Pt reports is now able to go without TENS unit of ice, able to negotiating stairs at home for a few times and walking and standing tolerance is improved    Pertinent History No complain of pain in the back but tightness in low back, pain in left knee and tingling in left foot today.   Currently in Pain? No/denies                         [facility] Adult PT Treatment/Exercise - 04/23/15 0001    Exercises   Exercises Lumbar   Lumbar Exercises: Stretches   Active Hamstring Stretch 3 reps;20 seconds  in sitting and supine with green strap -while on HMP   Single Knee to Chest Stretch 3 reps;20 seconds  diagonal knee to chest on HMP   Double Knee to Chest Stretch 3 reps;20 seconds  HMP   Lower Trunk Rotation 3 reps;20 seconds  HMP to help with stiffness   Lumbar Exercises: Supine   AB Set Limitations transversus ab  training and pelvic floor contraction-leg motions added   Lumbar Exercises: Prone   Other Prone Lumbar Exercises Prayer position x 3 with 20 sec hold, pillow b/w knees    Knee/Hip Exercises: Aerobic   Nustep Level 2x 8 min, seat &arms #8   Modalities   Modalities Traction   Traction   Type of Traction Lumbar   Min (lbs) 55   Max (lbs) 90   Hold Time 60   Rest Time 20   Time 15   Manual Therapy   Manual Therapy Soft tissue mobilization   Soft tissue mobilization in left & Rt sidelying ot Rt QL and lumbar paraspinals                  PT Short Term Goals - 02/24/15 1451    PT SHORT TERM GOAL #1   Title be independent in initial HEP   Status Achieved   PT SHORT TERM GOAL #2   Title report a 30% reduction in Rt hip/LE pain with standing activity   Time 4   Period Weeks   Status Achieved  since injection   PT SHORT TERM GOAL #3   Title stand with neutral posture >75% of the time   Status Achieved  standing neutral  PT Long Term Goals - 04/21/15 1506    PT LONG TERM GOAL #1   Title be independent in advanced HEP   Time 6   Period Weeks   Status On-going   PT LONG TERM GOAL #2   Title reduce FOTO to < or = to 42% limitation   Time 6   Period Weeks   Status On-going   PT LONG TERM GOAL #3   Title wean from cane for all distances   Time 8   Period Weeks   Status Achieved   PT LONG TERM GOAL #4   Title report a 70% reduction in Rt LE pain with standing and walking   Time 8   Period Weeks   Status Achieved   PT LONG TERM GOAL #5   Title report a 70% reduction in Lt LE pain with standing and walking   Time 8   Period Weeks   Status On-going   PT LONG TERM GOAL #6   Title stand and lift at work with 50% less pain  75% improvement   Time 6   Period Weeks   Status Achieved               Plan - 04/23/15 1420    Clinical Impression Statement Pt without complain of pain today, able to tolerate all exercises well, traction is helping.  Pt will continue to benefit from skilled PT and traction.   Pt will benefit from skilled therapeutic intervention in order to improve on the following deficits Decreased range of motion;Pain;Decreased endurance;Decreased activity tolerance;Decreased strength;Improper body mechanics   Rehab Potential Good   PT Frequency 2x / week   PT Duration 8 weeks   PT Treatment/Interventions ADLs/Self Care Home Management;Electrical Stimulation;Moist Heat;Cryotherapy;Traction;Ultrasound;Functional mobility training;Therapeutic activities;Therapeutic exercise;Manual techniques;Neuromuscular re-education;Passive range of motion   PT Next Visit Plan continue with traction and with gentle stretching and strengthening exercises   Consulted and Agree with Plan of Care Patient        Problem List There are no active problems to display for this patient.   [name],[name] PTA 04/23/2015, 2:46 PM  [facility] [address], [place], [place], [address] Phone: [phone]   Fax:  [phone]  Name: [name] MRN: [id number] Date of Birth: 08-Sep-1960

## 2015-04-28 ENCOUNTER — Encounter: Payer: Self-pay | Admitting: Physical Therapy

## 2015-04-28 ENCOUNTER — Ambulatory Visit: Payer: 59 | Admitting: Physical Therapy

## 2015-04-28 DIAGNOSIS — M5441 Lumbago with sciatica, right side: Secondary | ICD-10-CM

## 2015-04-28 DIAGNOSIS — R293 Abnormal posture: Secondary | ICD-10-CM

## 2015-04-28 DIAGNOSIS — M25651 Stiffness of right hip, not elsewhere classified: Secondary | ICD-10-CM

## 2015-04-28 NOTE — Therapy (Signed)
[facility] [address] [place], [place], [address] Phone: [phone]   Fax:  [phone]  Physical Therapy Treatment  Patient Details  Name: [name] MRN: [id number] Date of Birth: Jul 05, 1960 Referring Provider: Berle Mull, MD  Encounter Date: 04/28/2015      PT End of Session - 04/28/15 1504    Visit Number 18   Date for PT Re-Evaluation 05/15/15   PT Start Time 1447   PT Stop Time 1542   PT Time Calculation (min) 55 min   Activity Tolerance Patient tolerated treatment well   Behavior During Therapy [facility] for tasks assessed/performed      Past Medical History  Diagnosis Date  . IBS (irritable bowel syndrome)   . Hiatal hernia   . CMV (cytomegalovirus infection) ([place])     Past Surgical History  Procedure Laterality Date  . Drug induced hepatitis      There were no vitals filed for this visit.  Visit Diagnosis:  Right-sided low back pain with right-sided sciatica  Hip stiffness, right  Posture imbalance      Subjective Assessment - 04/28/15 1458    Subjective Pt reports feeling good with her back, used the TENS only once.    Currently in Pain? No/denies                         [facility] Adult PT Treatment/Exercise - 04/28/15 0001    Exercises   Exercises Lumbar   Lumbar Exercises: Stretches   Single Knee to Chest Stretch 3 reps;20 seconds  diagonal    Double Knee to Chest Stretch 3 reps;20 seconds   Lower Trunk Rotation 3 reps;20 seconds   Lumbar Exercises: Machines for Strengthening   Other Lumbar Machine Exercise Chestpress 15# 3x10   Lumbar Exercises: Supine   Other Supine Lumbar Exercises adduction activation with block b/w knees x 10 with 5 sec hold   Lumbar Exercises: Prone   Other Prone Lumbar Exercises Prayer position x 3 with 20 sec hold, pillow b/w knees    Knee/Hip Exercises: Aerobic   Other Aerobic UBE sitting on green physioball x6 (3/3)   Modalities    Modalities Traction   Traction   Type of Traction Lumbar   Min (lbs) 55   Max (lbs) 90   Hold Time 60   Rest Time 20   Time 15   Manual Therapy   Manual Therapy Soft tissue mobilization   Soft tissue mobilization in left & Rt sidelying ot Rt QL and lumbar paraspinals                  PT Short Term Goals - 02/24/15 1451    PT SHORT TERM GOAL #1   Title be independent in initial HEP   Status Achieved   PT SHORT TERM GOAL #2   Title report a 30% reduction in Rt hip/LE pain with standing activity   Time 4   Period Weeks   Status Achieved  since injection   PT SHORT TERM GOAL #3   Title stand with neutral posture >75% of the time   Status Achieved  standing neutral           PT Long Term Goals - 04/28/15 1506    PT LONG TERM GOAL #1   Title be independent in advanced HEP   Time 6   Period Weeks   Status On-going   PT LONG TERM GOAL #2  Title reduce FOTO to < or = to 42% limitation   Time 6   Period Weeks   Status On-going   PT LONG TERM GOAL #3   Title wean from cane for all distances   Time 8   Period Weeks   Status Achieved   PT LONG TERM GOAL #4   Title report a 70% reduction in Rt LE pain with standing and walking   Time 8   Period Weeks   Status Achieved   PT LONG TERM GOAL #5   Title report a 70% reduction in Lt LE pain with standing and walking   Time 8   Period Weeks   Status Achieved   PT LONG TERM GOAL #6   Title stand and lift at work with 50% less pain   Time 6   Period Weeks   Status Achieved               Plan - 04/28/15 1505    Clinical Impression Statement Pt with good performance of stretching and strengthening exercises. Pt will continue to benefit from killed PT and traction.   Pt will benefit from skilled therapeutic intervention in order to improve on the following deficits Decreased range of motion;Pain;Decreased endurance;Decreased activity tolerance;Decreased strength;Improper body mechanics   Rehab  Potential Good   PT Frequency 2x / week   PT Duration 8 weeks   PT Treatment/Interventions ADLs/Self Care Home Management;Electrical Stimulation;Moist Heat;Cryotherapy;Traction;Ultrasound;Functional mobility training;Therapeutic activities;Therapeutic exercise;Manual techniques;Neuromuscular re-education;Passive range of motion   PT Next Visit Plan continue with traction and with gentle stretching and strengthening exercises   Consulted and Agree with Plan of Care Patient        Problem List There are no active problems to display for this patient.   [name],Avien Taha PTA 04/28/2015, 5:37 PM  [facility] [address] [place], [place], [address] Phone: [phone]   Fax:  [phone]  Name: [name] MRN: [id number] Date of Birth: 1961-01-24

## 2015-04-30 ENCOUNTER — Ambulatory Visit: Payer: 59

## 2015-04-30 DIAGNOSIS — R293 Abnormal posture: Secondary | ICD-10-CM

## 2015-04-30 DIAGNOSIS — M5441 Lumbago with sciatica, right side: Secondary | ICD-10-CM

## 2015-04-30 DIAGNOSIS — M25651 Stiffness of right hip, not elsewhere classified: Secondary | ICD-10-CM

## 2015-04-30 NOTE — Therapy (Signed)
[facility] [address] [place], [place], [address] Phone: [phone]   Fax:  [phone]  Physical Therapy Treatment  Patient Details  Name: [name] MRN: [id number] Date of Birth: May 06, 1961 Referring Provider: Berle Mull, MD  Encounter Date: 04/30/2015      PT End of Session - 04/30/15 1525    Visit Number 19   Date for PT Re-Evaluation 05/15/15   PT Start Time [id number]   PT Stop Time 1540   PT Time Calculation (min) 55 min   Activity Tolerance Patient tolerated treatment well   Behavior During Therapy [facility] for tasks assessed/performed      Past Medical History  Diagnosis Date  . IBS (irritable bowel syndrome)   . Hiatal hernia   . CMV (cytomegalovirus infection) ([place])     Past Surgical History  Procedure Laterality Date  . Drug induced hepatitis      There were no vitals filed for this visit.  Visit Diagnosis:  Right-sided low back pain with right-sided sciatica  Hip stiffness, right  Posture imbalance      Subjective Assessment - 04/30/15 1450    Subjective Pt is 85% better.  Feeling goo, not using TENs or ice much.     Currently in Pain? No/denies                         [facility] Adult PT Treatment/Exercise - 04/30/15 0001    Lumbar Exercises: Machines for Strengthening   Other Lumbar Machine Exercise Chestpress 15# 3x10   Lumbar Exercises: Standing   Row Strengthening;Power tower;20 reps   Other Standing Lumbar Exercises walking pulling weight (25#) with abdominal bracing x20   Knee/Hip Exercises: Aerobic   Nustep Level 2x 8 min, seat &arms #8   Other Aerobic UBE sitting on green physioball x6 (3/3)   Modalities   Modalities Traction   Traction   Type of Traction Lumbar   Min (lbs) 55   Max (lbs) 90   Hold Time 60   Rest Time 20   Time 15                  PT Short Term Goals - 02/24/15 1451    PT SHORT TERM GOAL #1   Title be independent in  initial HEP   Status Achieved   PT SHORT TERM GOAL #2   Title report a 30% reduction in Rt hip/LE pain with standing activity   Time 4   Period Weeks   Status Achieved  since injection   PT SHORT TERM GOAL #3   Title stand with neutral posture >75% of the time   Status Achieved  standing neutral           PT Long Term Goals - 04/28/15 1506    PT LONG TERM GOAL #1   Title be independent in advanced HEP   Time 6   Period Weeks   Status On-going   PT LONG TERM GOAL #2   Title reduce FOTO to < or = to 42% limitation   Time 6   Period Weeks   Status On-going   PT LONG TERM GOAL #3   Title wean from cane for all distances   Time 8   Period Weeks   Status Achieved   PT LONG TERM GOAL #4   Title report a 70% reduction in Rt LE pain with standing and walking   Time 8   Period  Weeks   Status Achieved   PT LONG TERM GOAL #5   Title report a 70% reduction in Lt LE pain with standing and walking   Time 8   Period Weeks   Status Achieved   PT LONG TERM GOAL #6   Title stand and lift at work with 50% less pain   Time 6   Period Weeks   Status Achieved               Plan - 04/30/15 1451    Clinical Impression Statement Pt reports 85% overall improvement since the start of care.  Pt is starting to perform more activities at work and is more aware of her mechanics while doing these exercises.  Pt reports that traction has really helped her pain.  Pt will continue to benefit from skilled PT for strenth, UE strength to assist with work tasks and modalities, traction PRN.     Pt will benefit from skilled therapeutic intervention in order to improve on the following deficits Decreased range of motion;Pain;Decreased endurance;Decreased activity tolerance;Decreased strength;Improper body mechanics   Rehab Potential Good   PT Frequency 2x / week   PT Duration 8 weeks   PT Treatment/Interventions ADLs/Self Care Home Management;Electrical Stimulation;Moist  Heat;Cryotherapy;Traction;Ultrasound;Functional mobility training;Therapeutic activities;Therapeutic exercise;Manual techniques;Neuromuscular re-education;Passive range of motion   PT Next Visit Plan continue with traction and with gentle stretching and strengthening exercises   Consulted and Agree with Plan of Care Patient        Problem List There are no active problems to display for this patient.   TAKACS,KELLY, PT 04/30/2015, 3:27 PM  [facility] [address] [place], [place], [address] Phone: [phone]   Fax:  [phone]  Name: [name] MRN: [id number] Date of Birth: Jul 06, 1960

## 2015-05-05 ENCOUNTER — Encounter: Payer: Self-pay | Admitting: Physical Therapy

## 2015-05-05 ENCOUNTER — Ambulatory Visit: Payer: 59 | Admitting: Physical Therapy

## 2015-05-05 DIAGNOSIS — M5441 Lumbago with sciatica, right side: Secondary | ICD-10-CM

## 2015-05-05 DIAGNOSIS — R293 Abnormal posture: Secondary | ICD-10-CM

## 2015-05-05 DIAGNOSIS — M25651 Stiffness of right hip, not elsewhere classified: Secondary | ICD-10-CM

## 2015-05-05 NOTE — Therapy (Signed)
[facility] [address], [place], [place], [address] Phone: [phone]   Fax:  [phone]  Physical Therapy Treatment  Patient Details  Name: [name] MRN: [id number] Date of Birth: 29-Jul-1960 Referring Provider: Berle Mull, MD  Encounter Date: 05/05/2015      PT End of Session - 05/05/15 1500    Visit Number 20   Date for PT Re-Evaluation 05/15/15   PT Start Time 1445   PT Stop Time 1542   PT Time Calculation (min) 57 min   Activity Tolerance Patient tolerated treatment well   Behavior During Therapy [facility] for tasks assessed/performed      Past Medical History  Diagnosis Date  . IBS (irritable bowel syndrome)   . Hiatal hernia   . CMV (cytomegalovirus infection) ([place])     Past Surgical History  Procedure Laterality Date  . Drug induced hepatitis      There were no vitals filed for this visit.  Visit Diagnosis:  Right-sided low back pain with right-sided sciatica  Hip stiffness, right  Posture imbalance      Subjective Assessment - 05/05/15 1456    Subjective Pt feels good with her back                         [facility] Adult PT Treatment/Exercise - 05/05/15 0001    Exercises   Exercises Lumbar   Lumbar Exercises: Stretches   Active Hamstring Stretch 3 reps;20 seconds  in sitting   Lumbar Exercises: Machines for Strengthening   Other Lumbar Machine Exercise Chestpress 15# 3x10  with focus on core activation   Lumbar Exercises: Standing   Row Strengthening;Power tower;20 reps;10 reps   Other Standing Lumbar Exercises walking pulling weight (25#) with abdominal bracing x30   Lumbar Exercises: Prone   Other Prone Lumbar Exercises Prayer position x 3 with 20 sec hold, pillow b/w knees    Knee/Hip Exercises: Aerobic   Nustep Level 2x 7 min, seat &arms #8   Other Aerobic UBE sitting on green physioball x6 (3/3)   Modalities   Modalities Traction   Traction   Type of  Traction Lumbar   Min (lbs) 55   Max (lbs) 90   Hold Time 60   Rest Time 20   Time 15                  PT Short Term Goals - 02/24/15 1451    PT SHORT TERM GOAL #1   Title be independent in initial HEP   Status Achieved   PT SHORT TERM GOAL #2   Title report a 30% reduction in Rt hip/LE pain with standing activity   Time 4   Period Weeks   Status Achieved  since injection   PT SHORT TERM GOAL #3   Title stand with neutral posture >75% of the time   Status Achieved  standing neutral           PT Long Term Goals - 05/05/15 1508    PT LONG TERM GOAL #1   Title be independent in advanced HEP   Time 6   Period Weeks   Status On-going   PT LONG TERM GOAL #2   Title reduce FOTO to < or = to 42% limitation   Time 6   Period Weeks   Status On-going   PT LONG TERM GOAL #3   Title wean from cane for all distances   Time  8   Period Weeks   Status Achieved   PT LONG TERM GOAL #4   Title report a 70% reduction in Rt LE pain with standing and walking   Time 8   Period Weeks   Status Achieved   PT LONG TERM GOAL #5   Title report a 70% reduction in Lt LE pain with standing and walking   Time 8   Period Weeks   Status Achieved   PT LONG TERM GOAL #6   Title stand and lift at work with 50% less pain   Time 6   Period Weeks   Status Achieved               Plan - 05/05/15 1501    Clinical Impression Statement Pt feeling a little weak today 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 Pt  reports improvement with patient care is 50%. Pt was able to perform activities well. Pt will continue to benedfit from skilled PT    Pt will benefit from skilled therapeutic intervention in order to improve on the following deficits Decreased range of motion;Pain;Decreased endurance;Decreased activity tolerance;Decreased strength;Improper body mechanics   Rehab Potential Good   PT Frequency 2x / week   PT Duration 8 weeks   PT Treatment/Interventions ADLs/Self Care Home  Management;Electrical Stimulation;Moist Heat;Cryotherapy;Traction;Ultrasound;Functional mobility training;Therapeutic activities;Therapeutic exercise;Manual techniques;Neuromuscular re-education;Passive range of motion   PT Next Visit Plan May be D/C    Consulted and Agree with Plan of Care Patient        Problem List There are no active problems to display for this patient.   [name],[name] PTA 05/05/2015, 4:52 PM  [facility] [address] [place], [place], [address] Phone: [phone]   Fax:  [phone]  Name: [name] MRN: [id number] Date of Birth: 09/18/1960

## 2015-05-12 ENCOUNTER — Ambulatory Visit: Payer: 59 | Admitting: Physical Therapy

## 2015-05-12 ENCOUNTER — Encounter: Payer: Self-pay | Admitting: Physical Therapy

## 2015-05-12 DIAGNOSIS — M25651 Stiffness of right hip, not elsewhere classified: Secondary | ICD-10-CM

## 2015-05-12 DIAGNOSIS — M5441 Lumbago with sciatica, right side: Secondary | ICD-10-CM

## 2015-05-12 DIAGNOSIS — R293 Abnormal posture: Secondary | ICD-10-CM

## 2015-05-12 NOTE — Therapy (Signed)
[facility] [address], [place], [place], [address] Phone: [phone]   Fax:  [phone]  Physical Therapy Treatment  Patient Details  Name: [name] MRN: [id number] Date of Birth: [birth year] Referring Provider: Berle Mull, MD  Encounter Date: 05/12/2015      PT End of Session - 05/12/15 1515    Visit Number 21   Date for PT Re-Evaluation 05/15/15   PT Start Time 1446   PT Stop Time 1538   PT Time Calculation (min) 52 min   Activity Tolerance Patient tolerated treatment well   Behavior During Therapy [facility] for tasks assessed/performed      Past Medical History  Diagnosis Date  . IBS (irritable bowel syndrome)   . Hiatal hernia   . CMV (cytomegalovirus infection) ([place])     Past Surgical History  Procedure Laterality Date  . Drug induced hepatitis      There were no vitals filed for this visit.  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   Currently in Pain? No/denies                         [facility] Adult PT Treatment/Exercise - 05/12/15 0001    Exercises   Exercises Lumbar   Lumbar Exercises: Stretches   Active Hamstring Stretch 4 reps  in sitting   Lumbar Exercises: Machines for Strengthening   Other Lumbar Machine Exercise Chestpress 17# 2x10, horizontal and vertical grip  pt tolerated increase in weight well   Lumbar Exercises: Standing   Row Strengthening;Power tower;20 reps;10 reps  30#   Other Standing Lumbar Exercises walking pulling weight (25#) with abdominal bracing x30   Lumbar Exercises: Prone   Other Prone Lumbar Exercises Prayer position x 3 with 20 sec hold, pillow b/w knees    Knee/Hip  Exercises: Aerobic   Nustep Level 2x 7 min, seat &arms #8   Modalities   Modalities Traction   Traction   Type of Traction Lumbar   Min (lbs) 55   Max (lbs) 90   Hold Time 60   Rest Time 20   Time 15                  PT Short Term Goals - 02/24/15 1451    PT SHORT TERM GOAL #1   Title be independent in initial HEP   Status Achieved   PT SHORT TERM GOAL #2   Title report a 30% reduction in Rt hip/LE pain with standing activity   Time 4   Period Weeks   Status Achieved  since injection   PT SHORT TERM GOAL #3   Title stand with neutral posture >75% of the time   Status Achieved  standing neutral           PT Long Term Goals - 05/12/15 1518    PT LONG TERM GOAL #1   Title be independent in advanced HEP   Time 6   Period Weeks   Status On-going   PT LONG TERM GOAL #2   Title reduce FOTO to < or = to 42% limitation  31% as of Nov, 29,2016  Time 6   Period Weeks   Status Achieved   PT LONG TERM GOAL #3   Title wean from cane for all distances   Time 8   Period Weeks   Status Achieved   PT LONG TERM GOAL #4   Title report a 70% reduction in Rt LE pain with standing and walking   Time 8   Period Weeks   Status Achieved   PT LONG TERM GOAL #5   Title report a 70% reduction in Lt LE pain with standing and walking   Time 8   Period Weeks   Status Achieved   PT LONG TERM GOAL #6   Title stand and lift at work with 50% less pain   Time 6   Period Weeks   Status Achieved               Plan - 05/12/15 1516    Clinical Impression Statement Pt feeling great and demonstrates good techniques. Pt will continue to benefit from skilled PT    Pt will benefit from skilled therapeutic intervention in order to improve on the following deficits Decreased range of motion;Pain;Decreased endurance;Decreased activity tolerance;Decreased strength;Improper body mechanics   Rehab Potential Good   PT Frequency 2x / week   PT Duration 8 weeks   PT  Treatment/Interventions ADLs/Self Care Home Management;Electrical Stimulation;Moist Heat;Cryotherapy;Traction;Ultrasound;Functional mobility training;Therapeutic activities;Therapeutic exercise;Manual techniques;Neuromuscular re-education;Passive range of motion   PT Next Visit Plan D/C next visit   Consulted and Agree with Plan of Care Patient        Problem List There are no active problems to display for this patient.   [name],Erna Brossard PTA 05/12/2015, 3:56 PM  [facility] [address], [place], [place], [address] Phone: [phone]   Fax:  [phone]  Name: JENIAH LACHARITE MRN: [id number] Date of Birth: 08/30/1960

## 2015-05-14 ENCOUNTER — Ambulatory Visit: Payer: 59 | Attending: Sports Medicine

## 2015-05-14 DIAGNOSIS — R293 Abnormal posture: Secondary | ICD-10-CM | POA: Insufficient documentation

## 2015-05-14 DIAGNOSIS — M5441 Lumbago with sciatica, right side: Secondary | ICD-10-CM | POA: Diagnosis present

## 2015-05-14 NOTE — Therapy (Signed)
[facility] [address], [place], [place], [address] Phone: [phone]   Fax:  [phone]  Physical Therapy Treatment  Patient Details  Name: [name] MRN: [id number] Date of Birth: [birth year] Referring Provider: Berle Mull, MD  Encounter Date: 05/14/2015      PT End of Session - 05/14/15 1514    Visit Number 22   PT Start Time 4403   PT Stop Time 4742   PT Time Calculation (min) 59 min   Activity Tolerance Patient tolerated treatment well   Behavior During Therapy [facility] for tasks assessed/performed      Past Medical History  Diagnosis Date  . IBS (irritable bowel syndrome)   . Hiatal hernia   . CMV (cytomegalovirus infection) ([place])     Past Surgical History  Procedure Laterality Date  . Drug induced hepatitis      There were no vitals filed for this visit.  Visit Diagnosis:  Right-sided low back pain with right-sided sciatica  Posture imbalance      Subjective Assessment - 05/14/15 1455    Subjective Pt is ready for discharge.  Pt will exercise at the gym after discharge.     Currently in Pain? 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   Leisure walk for exercise, flower arranging   Cognition   Overall Cognitive Status Within Functional Limits for tasks assessed   Observation/Other Assessments   Focus on Therapeutic Outcomes (FOTO)  31% limitation                     [facility] Adult PT Treatment/Exercise - 05/14/15 0001    Lumbar Exercises: Stretches   Active Hamstring Stretch 4 reps  in sitting   Lumbar Exercises: Machines for Strengthening   Other Lumbar Machine Exercise Chestpress 17# 2x10, horizontal and vertical  grip  pt tolerated increase in weight well   Lumbar Exercises: Standing   Row Strengthening;Power tower;20 reps;10 reps  30#   Other Standing Lumbar Exercises walking pulling weight (25#) with abdominal bracing x30   Knee/Hip Exercises: Aerobic   Nustep Level 2x 7 min, seat &arms #8   Other Aerobic UBE sitting on green physioball x6 (3/3)   Modalities   Modalities Traction   Traction   Type of Traction Lumbar   Min (lbs) 55   Max (lbs) 90   Hold Time 60   Rest Time 20   Time 15                  PT Short Term Goals - 02/24/15 1451    PT SHORT TERM GOAL #1   Title be independent in initial HEP   Status Achieved   PT SHORT TERM GOAL #2   Title report a 30% reduction in Rt hip/LE pain with standing activity   Time 4   Period Weeks   Status Achieved  since injection   PT SHORT TERM GOAL #3   Title stand with neutral posture >75% of the time   Status Achieved  standing neutral           PT Long Term Goals - 05/14/15 1457    PT LONG TERM  GOAL #1   Title be independent in advanced HEP   Status Achieved   PT LONG TERM GOAL #2   Title reduce FOTO to < or = to 42% limitation   Status Achieved   PT LONG TERM GOAL #3   Title wean from cane for all distances   Status Achieved   PT LONG TERM GOAL #4   Title report a 70% reduction in Rt LE pain with standing and walking   PT LONG TERM GOAL #5   Title report a 70% reduction in Lt LE pain with standing and walking   Status Achieved   PT LONG TERM GOAL #6   Title stand and lift at work with 50% less pain   Status Achieved               Plan - 05/14/15 1458    Clinical Impression Statement Pt reports 95% overall improvement since the start of care.  Pt is begining to return to regular work function with fewer modifications needed.  Pt is able to tolerate all exercises in the clinic without difficulty.  Pt has met all goals.     PT Next Visit Plan D/C PT   Consulted and Agree with Plan of Care Patient         Problem List There are no active problems to display for this patient.  PHYSICAL THERAPY DISCHARGE SUMMARY  Visits from Start of Care: 22  Current functional level related to goals / functional outcomes: Pt attended 22 PT sessions and reports 95% overall improvement in symptoms since the start of care.  Pt will be discharged to HEP.   Remaining deficits: Pt has not returned to full function at work and is increasing activity daily.     Education / Equipment: HEP, Economist  Plan: Patient agrees to discharge.  Patient goals were met. Patient is being discharged due to meeting the stated rehab goals.  ?????     [name], PT 05/14/2015, 3:20 PM  [facility] [address] [place], [place], [address] Phone: [phone]   Fax:  [phone]  Name: [name] MRN: [id number] Date of Birth: Feb 21, 1961

## 2015-06-16 MED FILL — predniSONE 10 MG (48) TBPK: 10 | 12 days supply | Qty: 48 | Fill #0

## 2015-09-28 MED FILL — GAVILYTE-N SOLUTION: 420 | 2 days supply | Qty: 4000 | Fill #0

## 2015-09-30 DIAGNOSIS — K640 First degree hemorrhoids: Secondary | ICD-10-CM | POA: Diagnosis not present

## 2015-09-30 DIAGNOSIS — Z86010 Personal history of colonic polyps: Secondary | ICD-10-CM | POA: Diagnosis not present

## 2015-10-01 MED FILL — predniSONE 20 MG TABS: 20 | 1 days supply | Qty: 3 | Fill #0

## 2015-10-07 DIAGNOSIS — M545 Low back pain: Secondary | ICD-10-CM | POA: Diagnosis not present

## 2015-10-07 DIAGNOSIS — M5116 Intervertebral disc disorders with radiculopathy, lumbar region: Secondary | ICD-10-CM | POA: Diagnosis not present

## 2015-10-27 ENCOUNTER — Other Ambulatory Visit (HOSPITAL_COMMUNITY): Payer: Self-pay | Admitting: Sports Medicine

## 2015-10-27 DIAGNOSIS — M545 Low back pain: Secondary | ICD-10-CM

## 2015-10-28 ENCOUNTER — Ambulatory Visit (HOSPITAL_COMMUNITY): Admission: RE | Admit: 2015-10-28 | Payer: 59 | Source: Ambulatory Visit

## 2015-10-28 ENCOUNTER — Ambulatory Visit (HOSPITAL_COMMUNITY)
Admission: RE | Admit: 2015-10-28 | Discharge: 2015-10-28 | Disposition: A | Discharge status: Home / Self Care | Payer: 59 | Source: Ambulatory Visit | Attending: Sports Medicine | Admitting: Sports Medicine

## 2015-10-28 DIAGNOSIS — M5136 Other intervertebral disc degeneration, lumbar region: Secondary | ICD-10-CM | POA: Diagnosis not present

## 2015-10-28 DIAGNOSIS — M4806 Spinal stenosis, lumbar region: Secondary | ICD-10-CM | POA: Diagnosis not present

## 2015-10-28 DIAGNOSIS — M5442 Lumbago with sciatica, left side: Secondary | ICD-10-CM | POA: Insufficient documentation

## 2015-10-28 DIAGNOSIS — M5126 Other intervertebral disc displacement, lumbar region: Secondary | ICD-10-CM | POA: Insufficient documentation

## 2015-10-28 DIAGNOSIS — M545 Low back pain: Secondary | ICD-10-CM

## 2015-11-25 DIAGNOSIS — M5116 Intervertebral disc disorders with radiculopathy, lumbar region: Secondary | ICD-10-CM | POA: Diagnosis not present

## 2015-11-25 DIAGNOSIS — M545 Low back pain: Secondary | ICD-10-CM | POA: Diagnosis not present

## 2015-11-25 MED FILL — predniSONE 20 MG TABS: 20 | 1 days supply | Qty: 3 | Fill #0

## 2015-12-04 DIAGNOSIS — M5116 Intervertebral disc disorders with radiculopathy, lumbar region: Secondary | ICD-10-CM | POA: Diagnosis not present

## 2015-12-04 DIAGNOSIS — M545 Low back pain: Secondary | ICD-10-CM | POA: Diagnosis not present

## 2015-12-29 DIAGNOSIS — M545 Low back pain: Secondary | ICD-10-CM | POA: Diagnosis not present

## 2015-12-29 DIAGNOSIS — M5116 Intervertebral disc disorders with radiculopathy, lumbar region: Secondary | ICD-10-CM | POA: Diagnosis not present

## 2016-03-22 MED FILL — predniSONE 20 MG TABS: 20 | 1 days supply | Qty: 3 | Fill #0

## 2016-03-25 DIAGNOSIS — M5116 Intervertebral disc disorders with radiculopathy, lumbar region: Secondary | ICD-10-CM | POA: Diagnosis not present

## 2016-03-25 DIAGNOSIS — M545 Low back pain: Secondary | ICD-10-CM | POA: Diagnosis not present

## 2016-03-29 DIAGNOSIS — L821 Other seborrheic keratosis: Secondary | ICD-10-CM | POA: Diagnosis not present

## 2016-03-29 DIAGNOSIS — Z85828 Personal history of other malignant neoplasm of skin: Secondary | ICD-10-CM | POA: Diagnosis not present

## 2016-03-29 DIAGNOSIS — D225 Melanocytic nevi of trunk: Secondary | ICD-10-CM | POA: Diagnosis not present

## 2016-06-14 ENCOUNTER — Other Ambulatory Visit: Payer: Self-pay | Admitting: Advanced Practice Midwife

## 2016-06-14 DIAGNOSIS — Z1231 Encounter for screening mammogram for malignant neoplasm of breast: Secondary | ICD-10-CM

## 2016-07-04 DIAGNOSIS — H5213 Myopia, bilateral: Secondary | ICD-10-CM | POA: Diagnosis not present

## 2016-07-05 ENCOUNTER — Ambulatory Visit
Admission: RE | Admit: 2016-07-05 | Discharge: 2016-07-05 | Disposition: A | Discharge status: Home / Self Care | Payer: 59 | Source: Ambulatory Visit | Attending: Advanced Practice Midwife | Admitting: Advanced Practice Midwife

## 2016-07-05 DIAGNOSIS — Z1231 Encounter for screening mammogram for malignant neoplasm of breast: Secondary | ICD-10-CM | POA: Diagnosis not present

## 2016-07-26 ENCOUNTER — Other Ambulatory Visit (HOSPITAL_COMMUNITY): Payer: Self-pay | Admitting: Family Medicine

## 2016-07-26 DIAGNOSIS — K219 Gastro-esophageal reflux disease without esophagitis: Secondary | ICD-10-CM | POA: Diagnosis not present

## 2016-07-26 DIAGNOSIS — N3281 Overactive bladder: Secondary | ICD-10-CM | POA: Diagnosis not present

## 2016-07-26 DIAGNOSIS — Z86010 Personal history of colonic polyps: Secondary | ICD-10-CM | POA: Diagnosis not present

## 2016-07-26 DIAGNOSIS — Z0000 Encounter for general adult medical examination without abnormal findings: Secondary | ICD-10-CM | POA: Diagnosis not present

## 2016-07-26 DIAGNOSIS — N952 Postmenopausal atrophic vaginitis: Secondary | ICD-10-CM | POA: Diagnosis not present

## 2016-07-26 DIAGNOSIS — R102 Pelvic and perineal pain: Secondary | ICD-10-CM

## 2016-07-26 DIAGNOSIS — E669 Obesity, unspecified: Secondary | ICD-10-CM | POA: Diagnosis not present

## 2016-07-26 DIAGNOSIS — K580 Irritable bowel syndrome with diarrhea: Secondary | ICD-10-CM | POA: Diagnosis not present

## 2016-07-26 DIAGNOSIS — Z79899 Other long term (current) drug therapy: Secondary | ICD-10-CM | POA: Diagnosis not present

## 2016-07-28 ENCOUNTER — Ambulatory Visit (HOSPITAL_COMMUNITY): Payer: 59

## 2016-08-05 ENCOUNTER — Ambulatory Visit (HOSPITAL_COMMUNITY): Payer: 59

## 2016-08-11 ENCOUNTER — Ambulatory Visit (HOSPITAL_COMMUNITY): Payer: 59

## 2016-08-24 ENCOUNTER — Encounter (HOSPITAL_COMMUNITY): Payer: Self-pay

## 2016-08-24 ENCOUNTER — Ambulatory Visit (HOSPITAL_COMMUNITY): Payer: 59

## 2016-08-30 MED FILL — CHLORHEXIDINE 0.12% RINSE: 0.12 | 15 days supply | Qty: 473 | Fill #0

## 2016-08-30 MED FILL — AMOXICILLIN 500 MG CAPSULE: 500 | 7 days supply | Qty: 21 | Fill #0

## 2016-09-06 MED FILL — AMOXICILLIN 500 MG CAPSULE: 500 | 7 days supply | Qty: 21 | Fill #0

## 2016-09-06 MED FILL — HYDROCODON-APAP 5-325: 5-325 | 2 days supply | Qty: 16 | Fill #0

## 2016-09-11 MED FILL — CHLORHEXIDINE 0.12% RINSE: 0.12 | 15 days supply | Qty: 473 | Fill #0

## 2016-10-26 DIAGNOSIS — E782 Mixed hyperlipidemia: Secondary | ICD-10-CM | POA: Diagnosis not present

## 2016-11-11 MED FILL — predniSONE 20 MG TABS: 20 | 1 days supply | Qty: 3 | Fill #0

## 2016-11-18 DIAGNOSIS — M5116 Intervertebral disc disorders with radiculopathy, lumbar region: Secondary | ICD-10-CM | POA: Diagnosis not present

## 2016-11-18 DIAGNOSIS — M545 Low back pain: Secondary | ICD-10-CM | POA: Diagnosis not present

## 2016-11-18 DIAGNOSIS — M5126 Other intervertebral disc displacement, lumbar region: Secondary | ICD-10-CM | POA: Diagnosis not present

## 2016-12-15 MED FILL — AMOXICILLIN 500 MG CAPSULE: 500 | 7 days supply | Qty: 21 | Fill #0

## 2016-12-15 MED FILL — CHLORHEXIDINE 0.12% RINSE: 0.12 | 16 days supply | Qty: 473 | Fill #0

## 2016-12-29 DIAGNOSIS — M5116 Intervertebral disc disorders with radiculopathy, lumbar region: Secondary | ICD-10-CM | POA: Diagnosis not present

## 2016-12-29 DIAGNOSIS — M5126 Other intervertebral disc displacement, lumbar region: Secondary | ICD-10-CM | POA: Diagnosis not present

## 2016-12-29 DIAGNOSIS — M545 Low back pain: Secondary | ICD-10-CM | POA: Diagnosis not present

## 2016-12-29 MED FILL — predniSONE 10 MG (48) TBPK: 10 | 12 days supply | Qty: 48 | Fill #0

## 2017-01-03 DIAGNOSIS — M545 Low back pain: Secondary | ICD-10-CM | POA: Diagnosis not present

## 2017-01-09 DIAGNOSIS — M545 Low back pain: Secondary | ICD-10-CM | POA: Diagnosis not present

## 2017-01-09 DIAGNOSIS — M5126 Other intervertebral disc displacement, lumbar region: Secondary | ICD-10-CM | POA: Diagnosis not present

## 2017-01-09 DIAGNOSIS — M5116 Intervertebral disc disorders with radiculopathy, lumbar region: Secondary | ICD-10-CM | POA: Diagnosis not present

## 2017-02-07 DIAGNOSIS — M5116 Intervertebral disc disorders with radiculopathy, lumbar region: Secondary | ICD-10-CM | POA: Diagnosis not present

## 2017-02-07 DIAGNOSIS — M5126 Other intervertebral disc displacement, lumbar region: Secondary | ICD-10-CM | POA: Diagnosis not present

## 2017-02-07 DIAGNOSIS — M545 Low back pain: Secondary | ICD-10-CM | POA: Diagnosis not present

## 2017-02-07 MED FILL — predniSONE 20 MG TABS: 20 | 1 days supply | Qty: 3 | Fill #0

## 2017-02-07 MED FILL — GABAPENTIN 300 MG CAPSULE: 300 | 30 days supply | Qty: 90 | Fill #0

## 2017-02-24 DIAGNOSIS — M545 Low back pain: Secondary | ICD-10-CM | POA: Diagnosis not present

## 2017-02-24 DIAGNOSIS — M5116 Intervertebral disc disorders with radiculopathy, lumbar region: Secondary | ICD-10-CM | POA: Diagnosis not present

## 2017-02-24 DIAGNOSIS — M5126 Other intervertebral disc displacement, lumbar region: Secondary | ICD-10-CM | POA: Diagnosis not present

## 2017-03-14 DIAGNOSIS — M5116 Intervertebral disc disorders with radiculopathy, lumbar region: Secondary | ICD-10-CM | POA: Diagnosis not present

## 2017-03-14 DIAGNOSIS — M5126 Other intervertebral disc displacement, lumbar region: Secondary | ICD-10-CM | POA: Diagnosis not present

## 2017-03-14 DIAGNOSIS — M545 Low back pain: Secondary | ICD-10-CM | POA: Diagnosis not present

## 2017-03-29 DIAGNOSIS — D225 Melanocytic nevi of trunk: Secondary | ICD-10-CM | POA: Diagnosis not present

## 2017-03-29 DIAGNOSIS — D1801 Hemangioma of skin and subcutaneous tissue: Secondary | ICD-10-CM | POA: Diagnosis not present

## 2017-03-29 DIAGNOSIS — L570 Actinic keratosis: Secondary | ICD-10-CM | POA: Diagnosis not present

## 2017-03-29 DIAGNOSIS — Z85828 Personal history of other malignant neoplasm of skin: Secondary | ICD-10-CM | POA: Diagnosis not present

## 2017-03-29 DIAGNOSIS — L821 Other seborrheic keratosis: Secondary | ICD-10-CM | POA: Diagnosis not present

## 2017-03-29 DIAGNOSIS — B356 Tinea cruris: Secondary | ICD-10-CM | POA: Diagnosis not present

## 2017-03-29 MED FILL — NAFTIFINE HCL 2 % CREA: 2 | 14 days supply | Qty: 90 | Fill #0

## 2017-03-30 MED FILL — GABAPENTIN 300 MG CAPSULE: 300 | 30 days supply | Qty: 90 | Fill #1

## 2017-04-10 DIAGNOSIS — M47816 Spondylosis without myelopathy or radiculopathy, lumbar region: Secondary | ICD-10-CM | POA: Diagnosis not present

## 2017-04-10 DIAGNOSIS — M5136 Other intervertebral disc degeneration, lumbar region: Secondary | ICD-10-CM | POA: Diagnosis not present

## 2017-04-10 DIAGNOSIS — M546 Pain in thoracic spine: Secondary | ICD-10-CM | POA: Diagnosis not present

## 2017-04-10 DIAGNOSIS — M5416 Radiculopathy, lumbar region: Secondary | ICD-10-CM | POA: Diagnosis not present

## 2017-05-02 DIAGNOSIS — M5126 Other intervertebral disc displacement, lumbar region: Secondary | ICD-10-CM | POA: Diagnosis not present

## 2017-05-02 DIAGNOSIS — M5116 Intervertebral disc disorders with radiculopathy, lumbar region: Secondary | ICD-10-CM | POA: Diagnosis not present

## 2017-05-02 DIAGNOSIS — M545 Low back pain: Secondary | ICD-10-CM | POA: Diagnosis not present

## 2017-05-17 MED FILL — DULoxetine HCL 30 MG CPEP: 30 | 30 days supply | Qty: 60 | Fill #0

## 2017-05-30 DIAGNOSIS — M5416 Radiculopathy, lumbar region: Secondary | ICD-10-CM | POA: Diagnosis not present

## 2017-06-03 DIAGNOSIS — M5416 Radiculopathy, lumbar region: Secondary | ICD-10-CM | POA: Diagnosis not present

## 2017-06-07 DIAGNOSIS — M5416 Radiculopathy, lumbar region: Secondary | ICD-10-CM | POA: Diagnosis not present

## 2017-06-10 DIAGNOSIS — M5416 Radiculopathy, lumbar region: Secondary | ICD-10-CM | POA: Diagnosis not present

## 2017-06-14 DIAGNOSIS — M5416 Radiculopathy, lumbar region: Secondary | ICD-10-CM | POA: Diagnosis not present

## 2017-06-17 DIAGNOSIS — M5416 Radiculopathy, lumbar region: Secondary | ICD-10-CM | POA: Diagnosis not present

## 2017-06-20 DIAGNOSIS — M5416 Radiculopathy, lumbar region: Secondary | ICD-10-CM | POA: Diagnosis not present

## 2017-06-23 ENCOUNTER — Telehealth: Payer: 59 | Admitting: Family

## 2017-06-23 DIAGNOSIS — B9689 Other specified bacterial agents as the cause of diseases classified elsewhere: Secondary | ICD-10-CM | POA: Diagnosis not present

## 2017-06-23 DIAGNOSIS — J028 Acute pharyngitis due to other specified organisms: Secondary | ICD-10-CM

## 2017-06-23 MED ORDER — AZITHROMYCIN 250 MG PO TABS: ORAL_TABLET | ORAL | 0 refills | Status: AC

## 2017-06-23 MED ORDER — BENZONATATE 100 MG PO CAPS
100.0000 mg | ORAL_CAPSULE | Freq: Three times a day (TID) | ORAL | 0 refills | Status: AC | PRN
Start: 2017-06-23 — End: ?

## 2017-06-23 MED FILL — AZITHROMYCIN 250 MG TAB: 250 | 5 days supply | Qty: 6 | Fill #0

## 2017-06-23 MED FILL — BENZONATATE 100 MG CAP: 100 | 5 days supply | Qty: 30 | Fill #0

## 2017-06-23 NOTE — Progress Notes (Signed)

## 2017-06-24 DIAGNOSIS — M5416 Radiculopathy, lumbar region: Secondary | ICD-10-CM | POA: Diagnosis not present

## 2017-06-27 DIAGNOSIS — M5416 Radiculopathy, lumbar region: Secondary | ICD-10-CM | POA: Diagnosis not present

## 2017-07-01 DIAGNOSIS — M5416 Radiculopathy, lumbar region: Secondary | ICD-10-CM | POA: Diagnosis not present

## 2017-07-04 DIAGNOSIS — M5416 Radiculopathy, lumbar region: Secondary | ICD-10-CM | POA: Diagnosis not present

## 2017-07-05 DIAGNOSIS — M5126 Other intervertebral disc displacement, lumbar region: Secondary | ICD-10-CM | POA: Diagnosis not present

## 2017-07-05 DIAGNOSIS — M545 Low back pain: Secondary | ICD-10-CM | POA: Diagnosis not present

## 2017-07-05 DIAGNOSIS — M5116 Intervertebral disc disorders with radiculopathy, lumbar region: Secondary | ICD-10-CM | POA: Diagnosis not present

## 2017-07-08 DIAGNOSIS — M5416 Radiculopathy, lumbar region: Secondary | ICD-10-CM | POA: Diagnosis not present

## 2017-07-15 DIAGNOSIS — M5416 Radiculopathy, lumbar region: Secondary | ICD-10-CM | POA: Diagnosis not present

## 2017-07-18 DIAGNOSIS — M5416 Radiculopathy, lumbar region: Secondary | ICD-10-CM | POA: Diagnosis not present

## 2017-07-20 ENCOUNTER — Other Ambulatory Visit: Payer: Self-pay | Admitting: Advanced Practice Midwife

## 2017-07-20 DIAGNOSIS — Z1231 Encounter for screening mammogram for malignant neoplasm of breast: Secondary | ICD-10-CM

## 2017-07-20 DIAGNOSIS — M5416 Radiculopathy, lumbar region: Secondary | ICD-10-CM | POA: Diagnosis not present

## 2017-07-25 DIAGNOSIS — M5416 Radiculopathy, lumbar region: Secondary | ICD-10-CM | POA: Diagnosis not present

## 2017-07-29 DIAGNOSIS — M5416 Radiculopathy, lumbar region: Secondary | ICD-10-CM | POA: Diagnosis not present

## 2017-08-01 DIAGNOSIS — M5416 Radiculopathy, lumbar region: Secondary | ICD-10-CM | POA: Diagnosis not present

## 2017-08-01 MED FILL — predniSONE 20 MG TABS: 20 | 1 days supply | Qty: 3 | Fill #0

## 2017-08-04 DIAGNOSIS — M5116 Intervertebral disc disorders with radiculopathy, lumbar region: Secondary | ICD-10-CM | POA: Diagnosis not present

## 2017-08-04 DIAGNOSIS — M545 Low back pain: Secondary | ICD-10-CM | POA: Diagnosis not present

## 2017-08-04 DIAGNOSIS — M5126 Other intervertebral disc displacement, lumbar region: Secondary | ICD-10-CM | POA: Diagnosis not present

## 2017-08-08 DIAGNOSIS — M5416 Radiculopathy, lumbar region: Secondary | ICD-10-CM | POA: Diagnosis not present

## 2017-08-09 DIAGNOSIS — M549 Dorsalgia, unspecified: Secondary | ICD-10-CM | POA: Diagnosis not present

## 2017-08-09 DIAGNOSIS — N952 Postmenopausal atrophic vaginitis: Secondary | ICD-10-CM | POA: Diagnosis not present

## 2017-08-09 DIAGNOSIS — Z79899 Other long term (current) drug therapy: Secondary | ICD-10-CM | POA: Diagnosis not present

## 2017-08-09 DIAGNOSIS — N3281 Overactive bladder: Secondary | ICD-10-CM | POA: Diagnosis not present

## 2017-08-09 DIAGNOSIS — K219 Gastro-esophageal reflux disease without esophagitis: Secondary | ICD-10-CM | POA: Diagnosis not present

## 2017-08-09 DIAGNOSIS — Z13220 Encounter for screening for lipoid disorders: Secondary | ICD-10-CM | POA: Diagnosis not present

## 2017-08-09 DIAGNOSIS — K580 Irritable bowel syndrome with diarrhea: Secondary | ICD-10-CM | POA: Diagnosis not present

## 2017-08-09 DIAGNOSIS — Z0000 Encounter for general adult medical examination without abnormal findings: Secondary | ICD-10-CM | POA: Diagnosis not present

## 2017-08-10 DIAGNOSIS — H524 Presbyopia: Secondary | ICD-10-CM | POA: Diagnosis not present

## 2017-08-10 DIAGNOSIS — H52223 Regular astigmatism, bilateral: Secondary | ICD-10-CM | POA: Diagnosis not present

## 2017-08-10 DIAGNOSIS — H5213 Myopia, bilateral: Secondary | ICD-10-CM | POA: Diagnosis not present

## 2017-08-12 DIAGNOSIS — M5416 Radiculopathy, lumbar region: Secondary | ICD-10-CM | POA: Diagnosis not present

## 2017-08-14 ENCOUNTER — Ambulatory Visit
Admission: RE | Admit: 2017-08-14 | Discharge: 2017-08-14 | Disposition: A | Discharge status: Home / Self Care | Payer: 59 | Source: Ambulatory Visit | Attending: Advanced Practice Midwife | Admitting: Advanced Practice Midwife

## 2017-08-14 DIAGNOSIS — Z1231 Encounter for screening mammogram for malignant neoplasm of breast: Secondary | ICD-10-CM | POA: Diagnosis not present

## 2017-08-15 DIAGNOSIS — M5416 Radiculopathy, lumbar region: Secondary | ICD-10-CM | POA: Diagnosis not present

## 2017-08-22 DIAGNOSIS — M5416 Radiculopathy, lumbar region: Secondary | ICD-10-CM | POA: Diagnosis not present

## 2017-08-24 DIAGNOSIS — M5116 Intervertebral disc disorders with radiculopathy, lumbar region: Secondary | ICD-10-CM | POA: Diagnosis not present

## 2017-08-24 DIAGNOSIS — M5126 Other intervertebral disc displacement, lumbar region: Secondary | ICD-10-CM | POA: Diagnosis not present

## 2017-08-24 DIAGNOSIS — M545 Low back pain: Secondary | ICD-10-CM | POA: Diagnosis not present

## 2017-09-08 MED FILL — NAFTIFINE HCL 2 % CREA: 2 | 14 days supply | Qty: 90 | Fill #1

## 2017-09-11 MED FILL — DULoxetine HCL 30 MG CPEP: 30 | 30 days supply | Qty: 60 | Fill #1

## 2019-09-02 ENCOUNTER — Other Ambulatory Visit: Payer: Self-pay | Admitting: Family Medicine

## 2019-09-02 DIAGNOSIS — Z1231 Encounter for screening mammogram for malignant neoplasm of breast: Secondary | ICD-10-CM

## 2019-09-20 ENCOUNTER — Other Ambulatory Visit: Payer: Self-pay

## 2019-09-20 ENCOUNTER — Ambulatory Visit
Admission: RE | Admit: 2019-09-20 | Discharge: 2019-09-20 | Disposition: A | Discharge status: Home / Self Care | Payer: PRIVATE HEALTH INSURANCE | Source: Ambulatory Visit | Attending: Family Medicine | Admitting: Family Medicine

## 2019-09-20 DIAGNOSIS — Z1231 Encounter for screening mammogram for malignant neoplasm of breast: Secondary | ICD-10-CM

## 2019-09-21 ENCOUNTER — Ambulatory Visit: Payer: PRIVATE HEALTH INSURANCE | Attending: Internal Medicine

## 2019-09-21 DIAGNOSIS — Z23 Encounter for immunization: Secondary | ICD-10-CM

## 2019-09-21 NOTE — Progress Notes (Signed)
   Covid-19 Vaccination Clinic  Name:  Keyanda Hetman    MRN: [id number] DOB: 1960-08-12  09/21/2019  Ms. Degree was observed post Covid-19 immunization for 15 minutes without incident. She was provided with Vaccine Information Sheet and instruction to access the V-Safe system.   Ms. Baes was instructed to call 911 with any severe reactions post vaccine: Marland Kitchen Difficulty breathing  . Swelling of face and throat  . A fast heartbeat  . A bad rash all over body  . Dizziness and weakness   Immunizations Administered    Name Date Dose VIS Date Route   Pfizer COVID-19 Vaccine 09/21/2019  1:08 PM 0.3 mL 05/24/2019 Intramuscular   Manufacturer: [employer], [employer]   Lot: [id number]   [place]: [id number]

## 2019-10-15 ENCOUNTER — Ambulatory Visit: Payer: PRIVATE HEALTH INSURANCE | Attending: Internal Medicine

## 2019-10-15 DIAGNOSIS — Z23 Encounter for immunization: Secondary | ICD-10-CM

## 2019-10-15 NOTE — Progress Notes (Signed)
   Covid-19 Vaccination Clinic  Name:  [name]    MRN: [id number] DOB: [birth year]  10/15/2019  [name] was observed post Covid-19 immunization for 15 minutes without incident. She was provided with Vaccine Information Sheet and instruction to access the V-Safe system.   [name] was instructed to call 911 with any severe reactions post vaccine: Marland Kitchen Difficulty breathing  . Swelling of face and throat  . A fast heartbeat  . A bad rash all over body  . Dizziness and weakness   Immunizations Administered    Name Date Dose VIS Date Route   Pfizer COVID-19 Vaccine 10/15/2019  9:26 AM 0.3 mL 08/07/2018 Intramuscular   Manufacturer: [employer]   Lot: [id number]   [place]: [id number]

## 2020-09-07 ENCOUNTER — Other Ambulatory Visit (HOSPITAL_BASED_OUTPATIENT_CLINIC_OR_DEPARTMENT_OTHER): Payer: Self-pay | Admitting: Family Medicine

## 2020-09-07 DIAGNOSIS — Z1231 Encounter for screening mammogram for malignant neoplasm of breast: Secondary | ICD-10-CM

## 2020-09-21 ENCOUNTER — Other Ambulatory Visit: Payer: Self-pay

## 2020-09-21 ENCOUNTER — Ambulatory Visit (HOSPITAL_BASED_OUTPATIENT_CLINIC_OR_DEPARTMENT_OTHER)
Admission: RE | Admit: 2020-09-21 | Discharge: 2020-09-21 | Disposition: A | Discharge status: Home / Self Care | Payer: PRIVATE HEALTH INSURANCE | Source: Ambulatory Visit | Attending: Family Medicine | Admitting: Family Medicine

## 2020-09-21 DIAGNOSIS — Z1231 Encounter for screening mammogram for malignant neoplasm of breast: Secondary | ICD-10-CM | POA: Insufficient documentation

## 2022-02-24 ENCOUNTER — Other Ambulatory Visit (HOSPITAL_BASED_OUTPATIENT_CLINIC_OR_DEPARTMENT_OTHER): Payer: Self-pay | Admitting: Family Medicine

## 2022-02-24 DIAGNOSIS — Z1231 Encounter for screening mammogram for malignant neoplasm of breast: Secondary | ICD-10-CM

## 2022-04-05 ENCOUNTER — Ambulatory Visit (HOSPITAL_BASED_OUTPATIENT_CLINIC_OR_DEPARTMENT_OTHER)
Admission: RE | Admit: 2022-04-05 | Discharge: 2022-04-05 | Disposition: A | Discharge status: Home / Self Care | Payer: PRIVATE HEALTH INSURANCE | Source: Ambulatory Visit | Attending: Family Medicine | Admitting: Family Medicine

## 2022-04-05 DIAGNOSIS — Z1231 Encounter for screening mammogram for malignant neoplasm of breast: Secondary | ICD-10-CM | POA: Diagnosis present

## 2022-08-22 IMAGING — MG MM DIGITAL SCREENING BILAT W/ TOMO AND CAD
8 series · 9 of 24 positions shown · non-contrast
Comparison: Previous exam(s).

CLINICAL DATA: Screening.

EXAM:
DIGITAL SCREENING BILATERAL MAMMOGRAM WITH TOMOSYNTHESIS AND CAD
TECHNIQUE: Bilateral screening digital craniocaudal and mediolateral oblique
mammograms were obtained. Bilateral screening digital breast
tomosynthesis was performed. The images were evaluated with
computer-aided detection.

[L CC synth-2D]
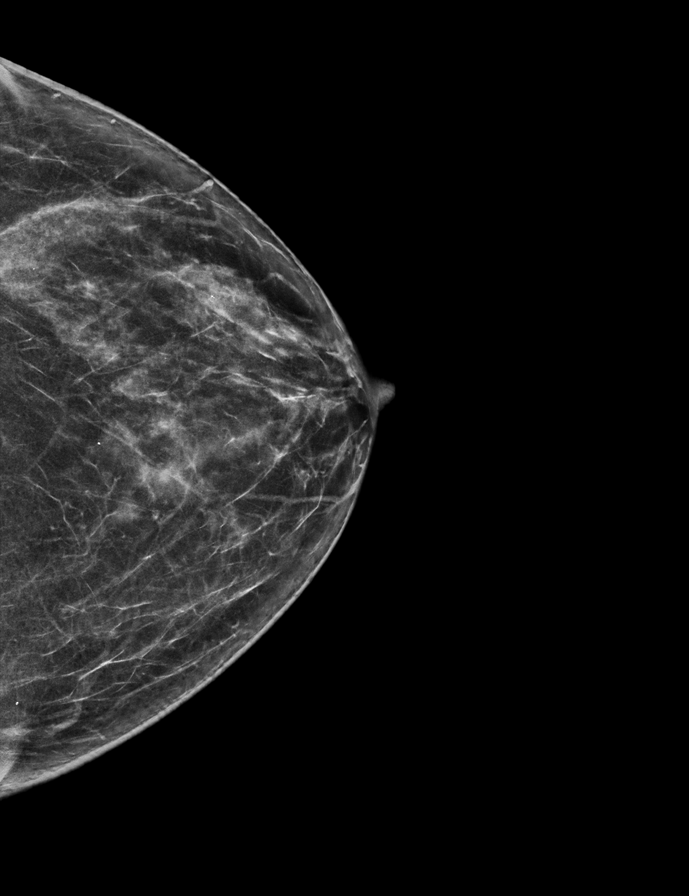

[L MLO synth-2D]
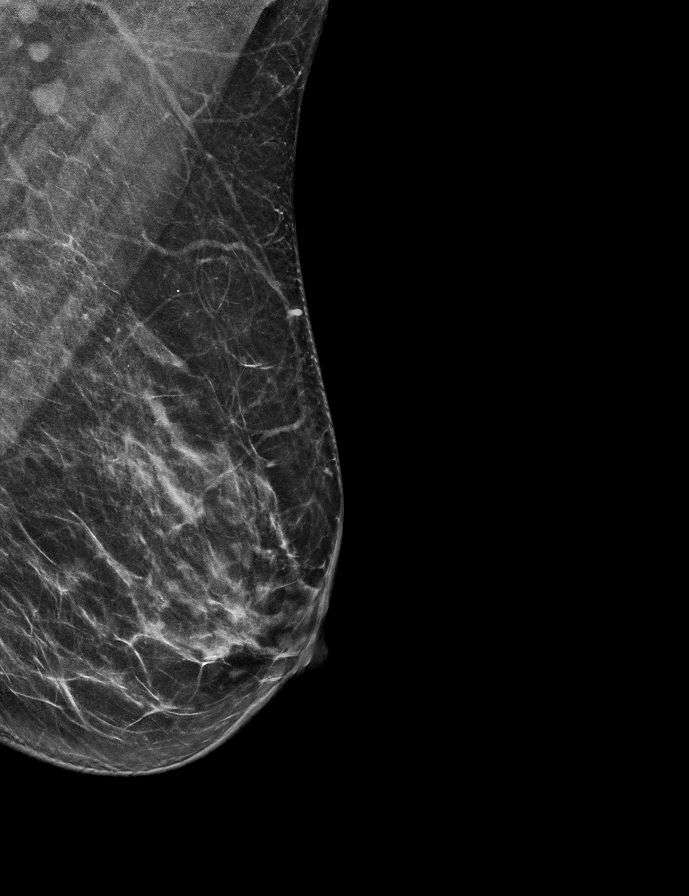

[R CC synth-2D]
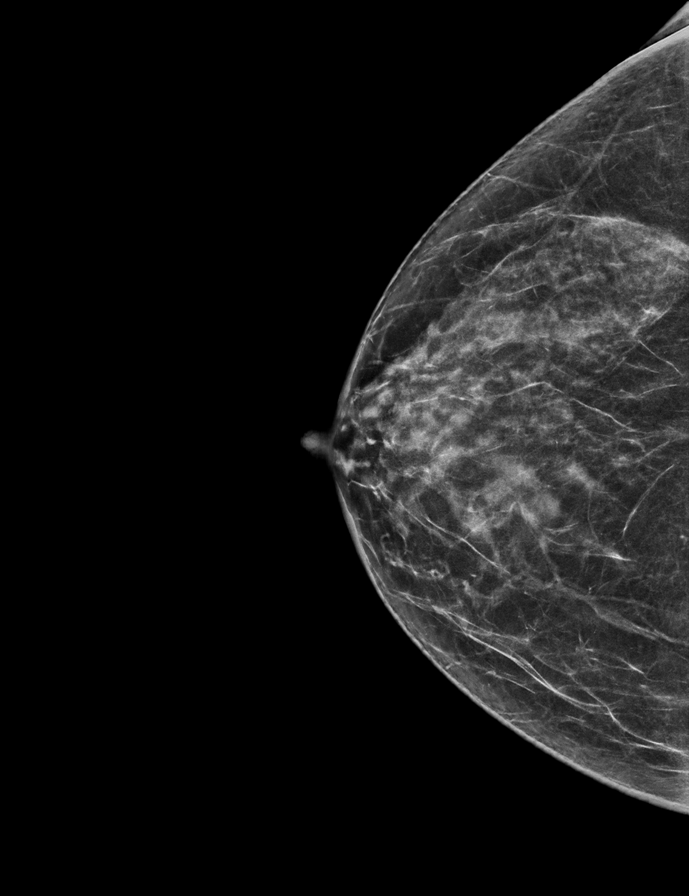

[R MLO synth-2D]
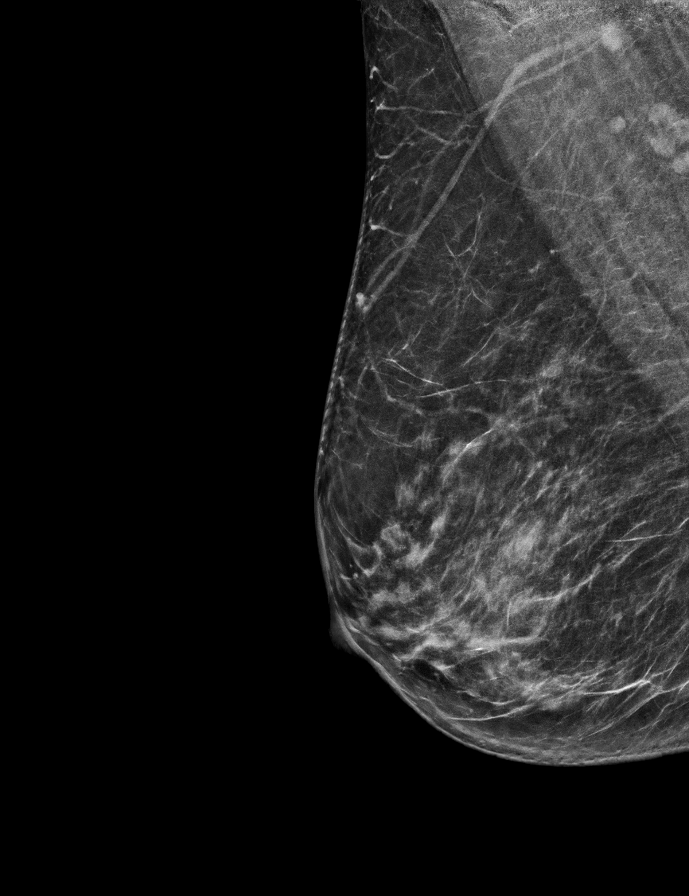

[L CC tomo · 2 of 62 frames shown]
[frame 21/62]
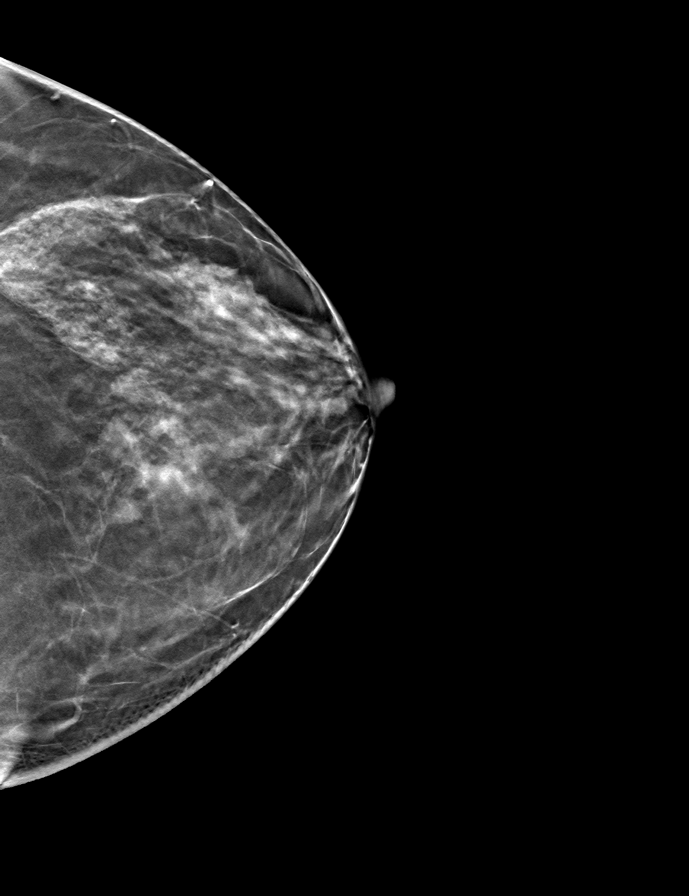
[frame 31/62]
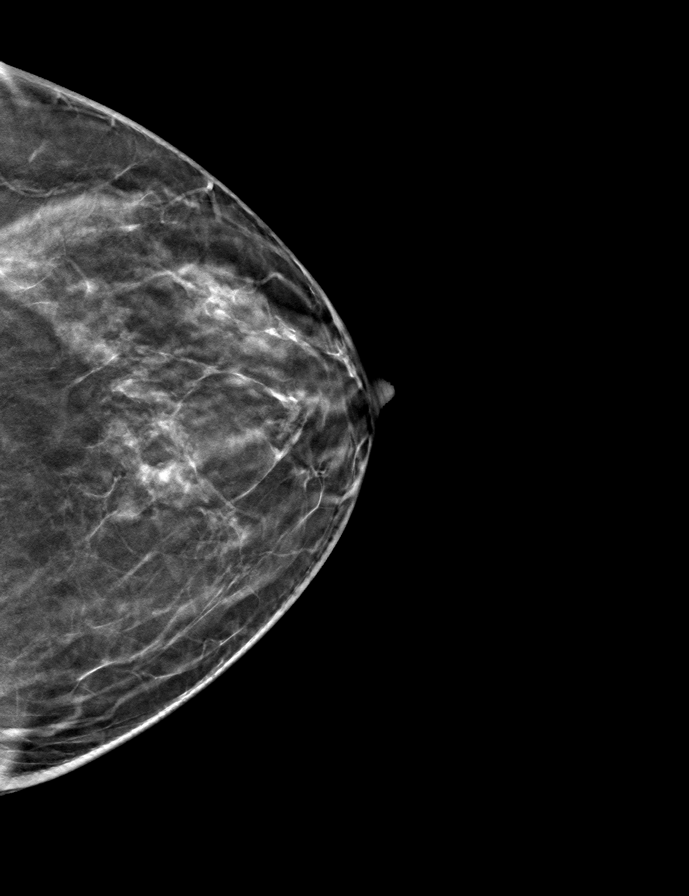

[R CC tomo · tomo slice 31/61.0]
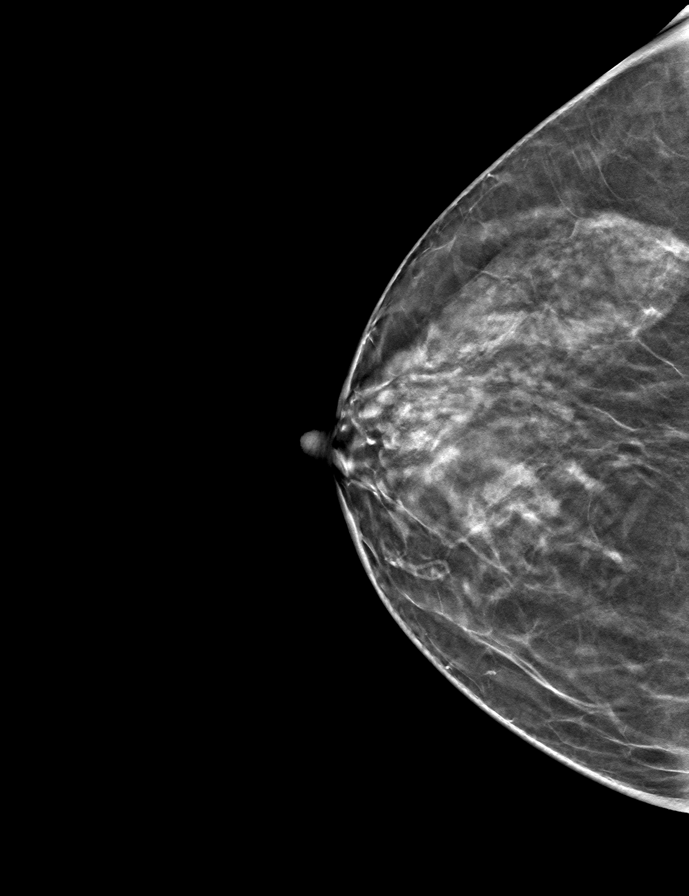

[R MLO tomo · tomo slice 33/65.0]
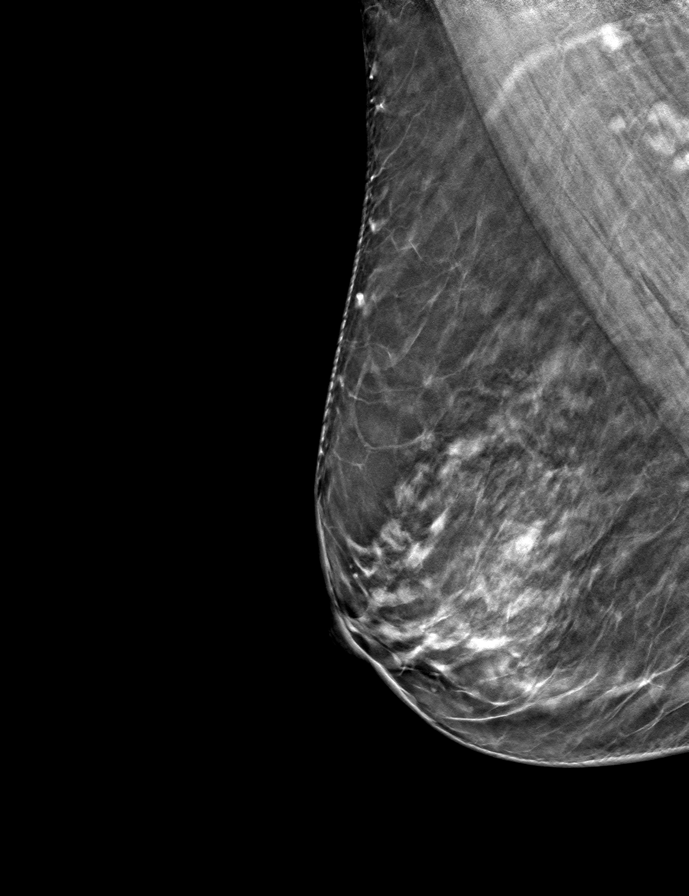

[L MLO tomo · tomo slice 33/66.0]
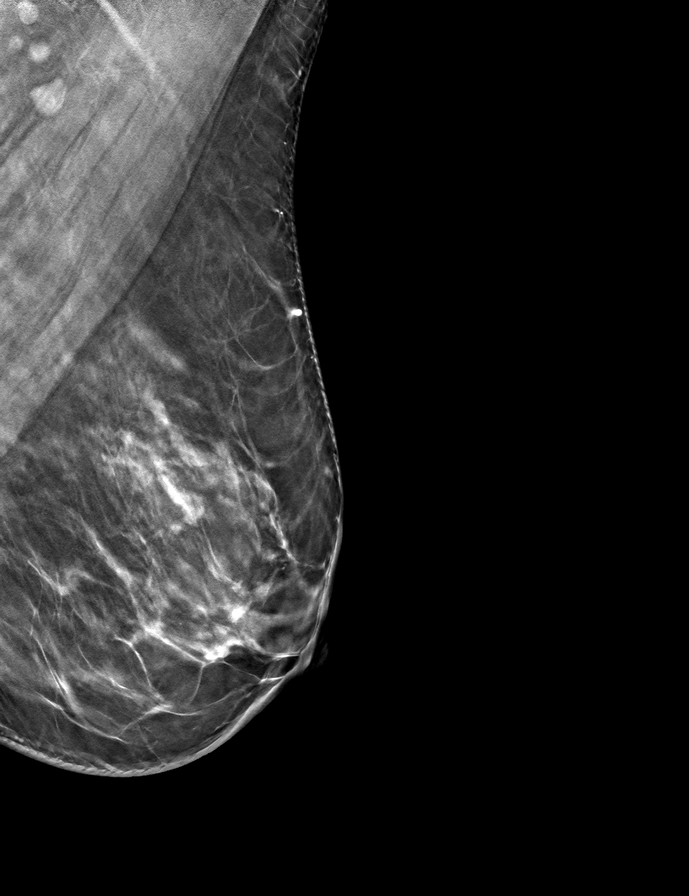

[9 of 24 positions shown; findings below may reference images not displayed]

ACR Breast Density Category c: The breast tissue is heterogeneously
dense, which may obscure small masses.
FINDINGS: There are no findings suspicious for malignancy. The images were
evaluated with computer-aided detection.
IMPRESSION: No mammographic evidence of malignancy. A result letter of this
screening mammogram will be mailed directly to the patient.

RECOMMENDATION:
Screening mammogram in one year. (Code:T4-5-GWO)

BI-RADS CATEGORY  1: Negative.

## 2023-06-26 ENCOUNTER — Other Ambulatory Visit (HOSPITAL_BASED_OUTPATIENT_CLINIC_OR_DEPARTMENT_OTHER): Payer: Self-pay | Admitting: Family Medicine

## 2023-06-26 DIAGNOSIS — Z1231 Encounter for screening mammogram for malignant neoplasm of breast: Secondary | ICD-10-CM

## 2023-07-17 ENCOUNTER — Ambulatory Visit (HOSPITAL_BASED_OUTPATIENT_CLINIC_OR_DEPARTMENT_OTHER): Payer: PRIVATE HEALTH INSURANCE | Admitting: Radiology

## 2023-08-01 ENCOUNTER — Ambulatory Visit (HOSPITAL_BASED_OUTPATIENT_CLINIC_OR_DEPARTMENT_OTHER)
Admission: RE | Admit: 2023-08-01 | Discharge: 2023-08-01 | Disposition: A | Discharge status: Home / Self Care | Payer: PRIVATE HEALTH INSURANCE | Source: Ambulatory Visit | Attending: Family Medicine | Admitting: Family Medicine

## 2023-08-01 ENCOUNTER — Encounter (HOSPITAL_BASED_OUTPATIENT_CLINIC_OR_DEPARTMENT_OTHER): Payer: Self-pay | Admitting: Radiology

## 2023-08-01 DIAGNOSIS — Z1231 Encounter for screening mammogram for malignant neoplasm of breast: Secondary | ICD-10-CM | POA: Insufficient documentation

## 2023-08-07 ENCOUNTER — Other Ambulatory Visit: Payer: Self-pay | Admitting: Family Medicine

## 2023-08-07 DIAGNOSIS — R928 Other abnormal and inconclusive findings on diagnostic imaging of breast: Secondary | ICD-10-CM

## 2023-08-18 ENCOUNTER — Ambulatory Visit
Admission: RE | Admit: 2023-08-18 | Discharge: 2023-08-18 | Disposition: A | Discharge status: Home / Self Care | Payer: PRIVATE HEALTH INSURANCE | Source: Ambulatory Visit | Attending: Family Medicine | Admitting: Family Medicine

## 2023-08-18 ENCOUNTER — Other Ambulatory Visit: Payer: Self-pay | Admitting: Family Medicine

## 2023-08-18 DIAGNOSIS — R928 Other abnormal and inconclusive findings on diagnostic imaging of breast: Secondary | ICD-10-CM

## 2023-08-18 DIAGNOSIS — N6320 Unspecified lump in the left breast, unspecified quadrant: Secondary | ICD-10-CM

## 2023-08-18 DIAGNOSIS — N6489 Other specified disorders of breast: Secondary | ICD-10-CM

## 2023-08-24 ENCOUNTER — Ambulatory Visit
Admission: RE | Admit: 2023-08-24 | Discharge: 2023-08-24 | Disposition: A | Discharge status: Home / Self Care | Payer: PRIVATE HEALTH INSURANCE | Source: Ambulatory Visit | Attending: Family Medicine | Admitting: Family Medicine

## 2023-08-24 DIAGNOSIS — N6489 Other specified disorders of breast: Secondary | ICD-10-CM

## 2023-08-24 HISTORY — PX: BREAST BIOPSY: SHX20

## 2023-08-25 LAB — SURGICAL PATHOLOGY

## 2023-11-27 ENCOUNTER — Other Ambulatory Visit (HOSPITAL_BASED_OUTPATIENT_CLINIC_OR_DEPARTMENT_OTHER): Payer: Self-pay | Admitting: Family Medicine

## 2023-11-27 DIAGNOSIS — E2839 Other primary ovarian failure: Secondary | ICD-10-CM

## 2024-07-22 ENCOUNTER — Other Ambulatory Visit (HOSPITAL_BASED_OUTPATIENT_CLINIC_OR_DEPARTMENT_OTHER): Payer: PRIVATE HEALTH INSURANCE
